# Patient Record
Sex: Male | Born: 1970 | Race: White | Hispanic: No | Marital: Married | Smoking: Current every day smoker
Health system: Southern US, Community
[De-identification: ages and names within clinical notes are randomized; demographics above are authoritative.]

## PROBLEM LIST (undated history)

## (undated) DIAGNOSIS — F319 Bipolar disorder, unspecified: Secondary | ICD-10-CM

## (undated) DIAGNOSIS — G459 Transient cerebral ischemic attack, unspecified: Secondary | ICD-10-CM

## (undated) DIAGNOSIS — I639 Cerebral infarction, unspecified: Secondary | ICD-10-CM

## (undated) DIAGNOSIS — E7800 Pure hypercholesterolemia, unspecified: Secondary | ICD-10-CM

## (undated) DIAGNOSIS — E119 Type 2 diabetes mellitus without complications: Secondary | ICD-10-CM

## (undated) HISTORY — PX: NO PAST SURGERIES: SHX2092

## (undated) HISTORY — DX: Bipolar disorder, unspecified: F31.9

## (undated) HISTORY — DX: Cerebral infarction, unspecified: I63.9

---

## 2004-07-24 ENCOUNTER — Emergency Department (HOSPITAL_COMMUNITY): Admission: EM | Admit: 2004-07-24 | Discharge: 2004-07-24 | Payer: Self-pay | Admitting: Emergency Medicine

## 2009-11-09 DIAGNOSIS — M545 Low back pain, unspecified: Secondary | ICD-10-CM | POA: Insufficient documentation

## 2012-01-22 DIAGNOSIS — F319 Bipolar disorder, unspecified: Secondary | ICD-10-CM | POA: Insufficient documentation

## 2017-01-23 ENCOUNTER — Emergency Department (HOSPITAL_COMMUNITY): Payer: Medicaid Other

## 2017-01-23 ENCOUNTER — Encounter (HOSPITAL_COMMUNITY): Payer: Self-pay | Admitting: *Deleted

## 2017-01-23 ENCOUNTER — Emergency Department (HOSPITAL_COMMUNITY)
Admission: EM | Admit: 2017-01-23 | Discharge: 2017-01-23 | Disposition: A | Discharge status: Home / Self Care | Payer: Medicaid Other | Attending: Emergency Medicine | Admitting: Emergency Medicine

## 2017-01-23 DIAGNOSIS — F17210 Nicotine dependence, cigarettes, uncomplicated: Secondary | ICD-10-CM | POA: Insufficient documentation

## 2017-01-23 DIAGNOSIS — Y92003 Bedroom of unspecified non-institutional (private) residence as the place of occurrence of the external cause: Secondary | ICD-10-CM | POA: Insufficient documentation

## 2017-01-23 DIAGNOSIS — Y939 Activity, unspecified: Secondary | ICD-10-CM | POA: Insufficient documentation

## 2017-01-23 DIAGNOSIS — E119 Type 2 diabetes mellitus without complications: Secondary | ICD-10-CM | POA: Insufficient documentation

## 2017-01-23 DIAGNOSIS — Z880 Allergy status to penicillin: Secondary | ICD-10-CM | POA: Diagnosis not present

## 2017-01-23 DIAGNOSIS — T189XXA Foreign body of alimentary tract, part unspecified, initial encounter: Secondary | ICD-10-CM | POA: Diagnosis present

## 2017-01-23 DIAGNOSIS — Z8673 Personal history of transient ischemic attack (TIA), and cerebral infarction without residual deficits: Secondary | ICD-10-CM | POA: Diagnosis not present

## 2017-01-23 DIAGNOSIS — Y33XXXA Other specified events, undetermined intent, initial encounter: Secondary | ICD-10-CM | POA: Diagnosis not present

## 2017-01-23 DIAGNOSIS — Y999 Unspecified external cause status: Secondary | ICD-10-CM | POA: Diagnosis not present

## 2017-01-23 HISTORY — DX: Transient cerebral ischemic attack, unspecified: G45.9

## 2017-01-23 HISTORY — DX: Type 2 diabetes mellitus without complications: E11.9

## 2017-01-23 HISTORY — DX: Pure hypercholesterolemia, unspecified: E78.00

## 2017-01-23 NOTE — ED Provider Notes (Signed)
[facility] DEPT Provider Note   CSN: [id number] Arrival date & time: 01/23/17  1555     History   Chief Complaint Chief Complaint  Patient presents with  . Swallowed Foreign Body    HPI Ernest Montoya is a 46 y.o. male.  Patient had a tongue stud that he believes came loose during the night in the swallowed it. Had a foreign body sensation in the lower part of his neck. Patient's been able to eat and drink and swallow without any difficulty. Patient denies any abdominal pain any trouble breathing no choking no nausea or vomiting. No blood in the stools.      Past Medical History:  Diagnosis Date  . Diabetes mellitus without complication (HCC)   . High cholesterol   . TIA (transient ischemic attack)    x2    There are no active problems to display for this patient.   History reviewed. No pertinent surgical history.     Home Medications    Prior to Admission medications   Medication Sig Start Date End Date Taking? Authorizing Provider  atorvastatin (LIPITOR) 40 MG tablet Take 40 mg by mouth daily at 6 PM.  11/04/16  Yes [provider]    Family History No family history on file.  Social History Social History  Substance Use Topics  . Smoking status: Current Every Day Smoker    Packs/day: 2.00    Types: Cigarettes  . Smokeless tobacco: Never Used  . Alcohol use Yes     Comment: very rarely     Allergies   Penicillins   Review of Systems Review of Systems  Constitutional: Negative for fever.  HENT: Negative for congestion.   Eyes: Negative for redness.  Respiratory: Negative for shortness of breath.   Cardiovascular: Negative for chest pain.  Gastrointestinal: Negative for abdominal pain, blood in stool, nausea and vomiting.  Genitourinary: Negative for dysuria.  Musculoskeletal: Negative for back pain.  Skin: Negative for rash.  Neurological: Negative for headaches.  Hematological: Does not bruise/bleed easily.    Psychiatric/Behavioral: Negative for confusion.     Physical Exam Updated Vital Signs BP 135/84   Pulse 66   Temp 98 F (36.7 C) (Oral)   Resp 16   Ht 1.753 m ( )   Wt 108.9 kg (240 lb)   SpO2 100%   BMI 35.44 kg/m   Physical Exam  Constitutional: He is oriented to person, place, and time. He appears well-developed and well-nourished. No distress.  HENT:  Head: Normocephalic and atraumatic.  Mouth/Throat: Oropharynx is clear and moist.  Eyes: Pupils are equal, round, and reactive to light. Conjunctivae and EOM are normal.  Neck: Normal range of motion. Neck supple.  Cardiovascular: Normal rate, regular rhythm and normal heart sounds.   Pulmonary/Chest: Effort normal and breath sounds normal. No respiratory distress.  Abdominal: Soft. Bowel sounds are normal. There is no tenderness.  Musculoskeletal: Normal range of motion.  Neurological: He is alert and oriented to person, place, and time. No cranial nerve deficit or sensory deficit. He exhibits normal muscle tone. Coordination normal.  Skin: Skin is warm.  Nursing note and vitals reviewed.    ED Treatments / Results  Labs (all labs ordered are listed, but only abnormal results are displayed) Labs Reviewed - No data to display  EKG  EKG Interpretation None       Radiology Dg Neck Soft Tissue  Result Date: 01/23/2017 CLINICAL DATA:  Throat discomfort. Patient possibly swallowed tongue ring. EXAM: NECK  SOFT TISSUES - 1+ VIEW COMPARISON:  None. FINDINGS: There is no evidence of retropharyngeal soft tissue swelling or epiglottic enlargement. The cervical airway is unremarkable and no radio-opaque foreign body identified in the neck. IMPRESSION: No radiopaque foreign body or significant soft tissue swelling in the neck. Electronically Signed   By: Delbert Phenix M.D.   On: 01/23/2017 18:02   Dg Abd Acute W/chest  Result Date: 01/23/2017 CLINICAL DATA:  Possible foreign body EXAM: DG ABDOMEN ACUTE W/ 1V CHEST  COMPARISON:  CT 07/24/2004 FINDINGS: Single-view chest demonstrates no acute consolidation or effusion. Normal cardiomediastinal silhouette. Supine and upright views of the abdomen demonstrate no free air beneath the diaphragm. Nonobstructed gas pattern. Two metallic density foreign bodies are seen in the right lower quadrant, projecting over the cecal region IMPRESSION: 1. No acute cardiopulmonary disease 2. Nonobstructed gas pattern 3. Metallic foreign bodies projecting over the right lower quadrant, presumably corresponding to the patient's history of swallowed tongue ring Electronically Signed   By: Jasmine Pang M.D.   On: 01/23/2017 18:02    Procedures Procedures (including critical care time)  Medications Ordered in ED Medications - No data to display   Initial Impression / Assessment and Plan / ED Course  I have reviewed the triage vital signs and the nursing notes.  Pertinent labs & imaging results that were available during my care of the patient were reviewed by me and considered in my medical decision making (see chart for details).     X-rays show evidence of foreign body right lower quadrant. Most likely patient will be able to pass it. Patient given precautions. We discharged home.    Final Clinical Impressions(s) / ED Diagnoses   Final diagnoses:  Foreign body alimentary tract, initial encounter    New Prescriptions New Prescriptions   No medications on file     Vanetta Mulders, MD 01/23/17 1840

## 2017-01-23 NOTE — ED Triage Notes (Signed)
Pt c/o throat discomfort. Pt reports about 0700 this morning he realized his tongue ring was no longer in place and thinks he may have swallowed it. Pt able to maintain saliva, breathe and speak freely.

## 2017-01-23 NOTE — Discharge Instructions (Signed)
He should pass the foreign body without difficulties. Return for abdominal pain nausea vomiting or fevers. Make an appointment to follow-up with your clinic for recheck.

## 2018-01-14 DIAGNOSIS — L02211 Cutaneous abscess of abdominal wall: Secondary | ICD-10-CM | POA: Diagnosis not present

## 2018-03-14 IMAGING — DX DG ABDOMEN ACUTE W/ 1V CHEST
3 series · 3 of 3 positions shown · non-contrast
Comparison: CT 07/24/2004

CLINICAL DATA: Possible foreign body

EXAM:
DG ABDOMEN ACUTE W/ 1V CHEST

[chest pa]
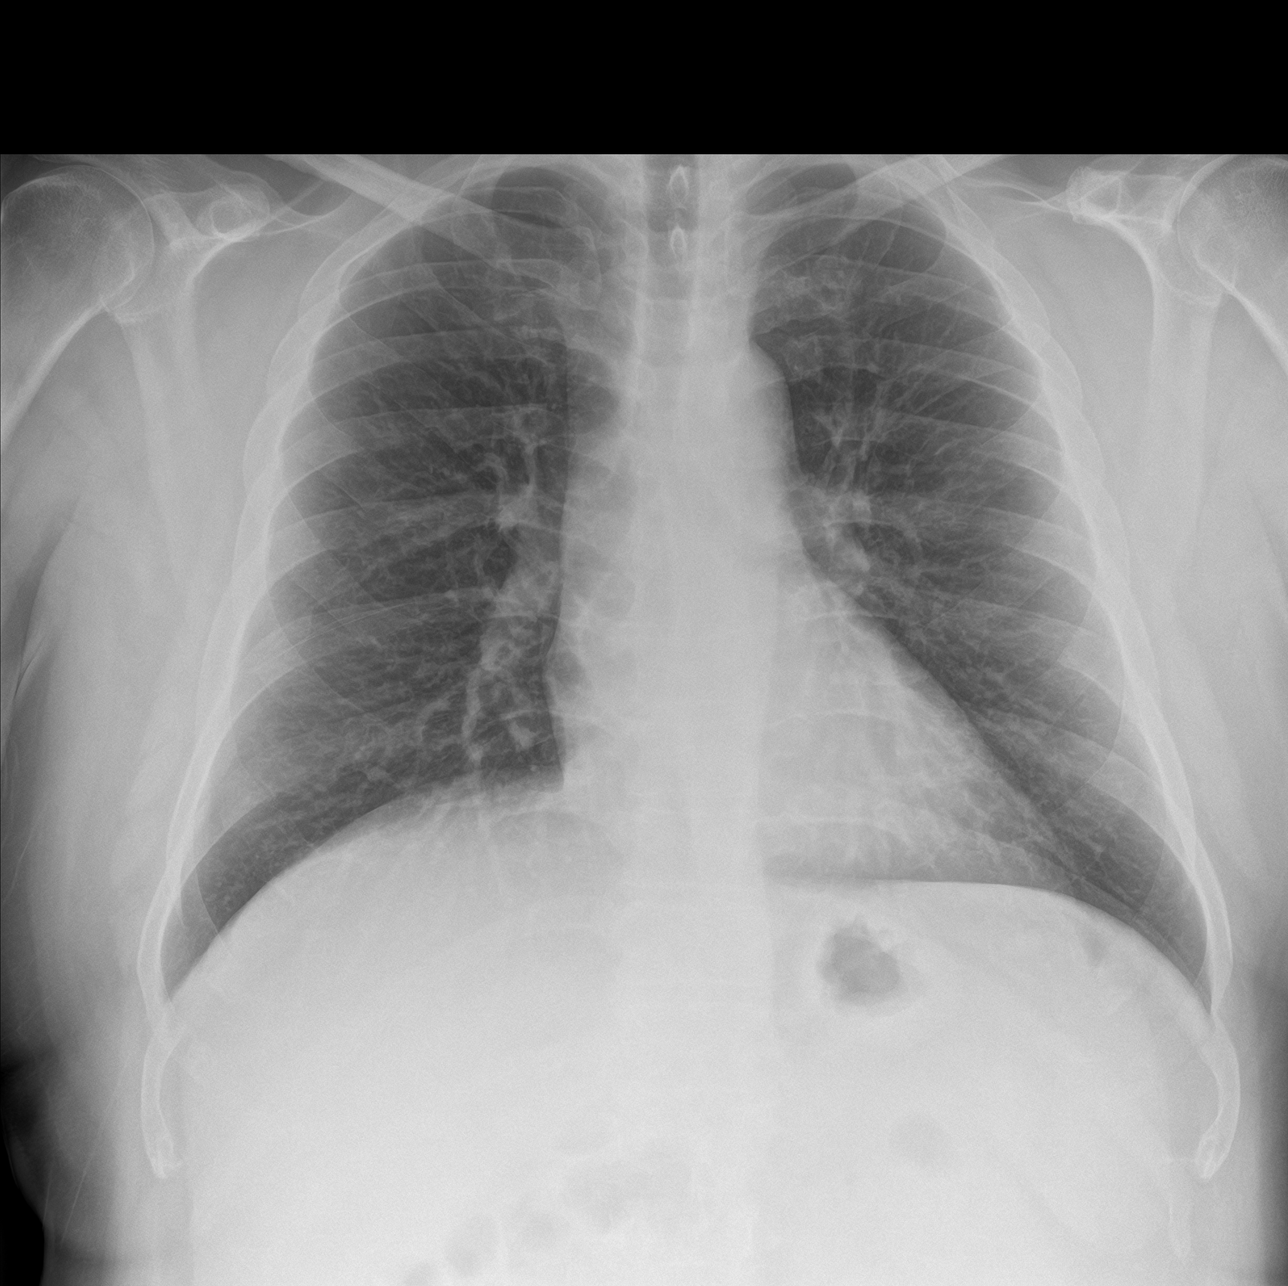

[abdomen erect]
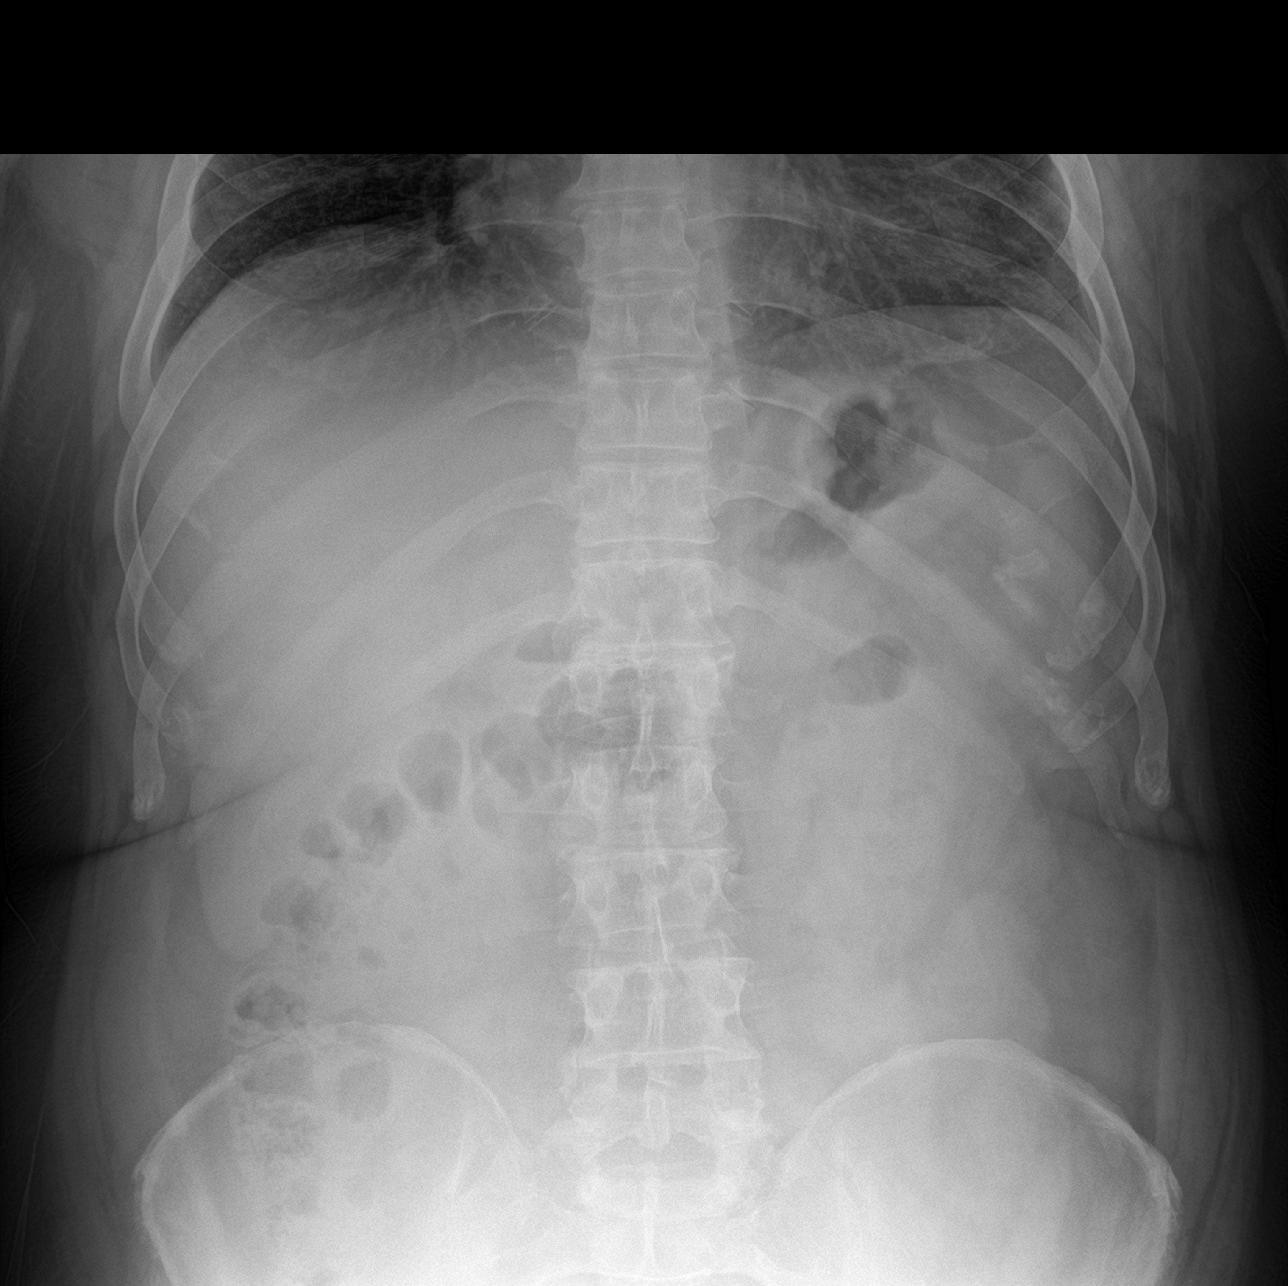

[abdomen supine]
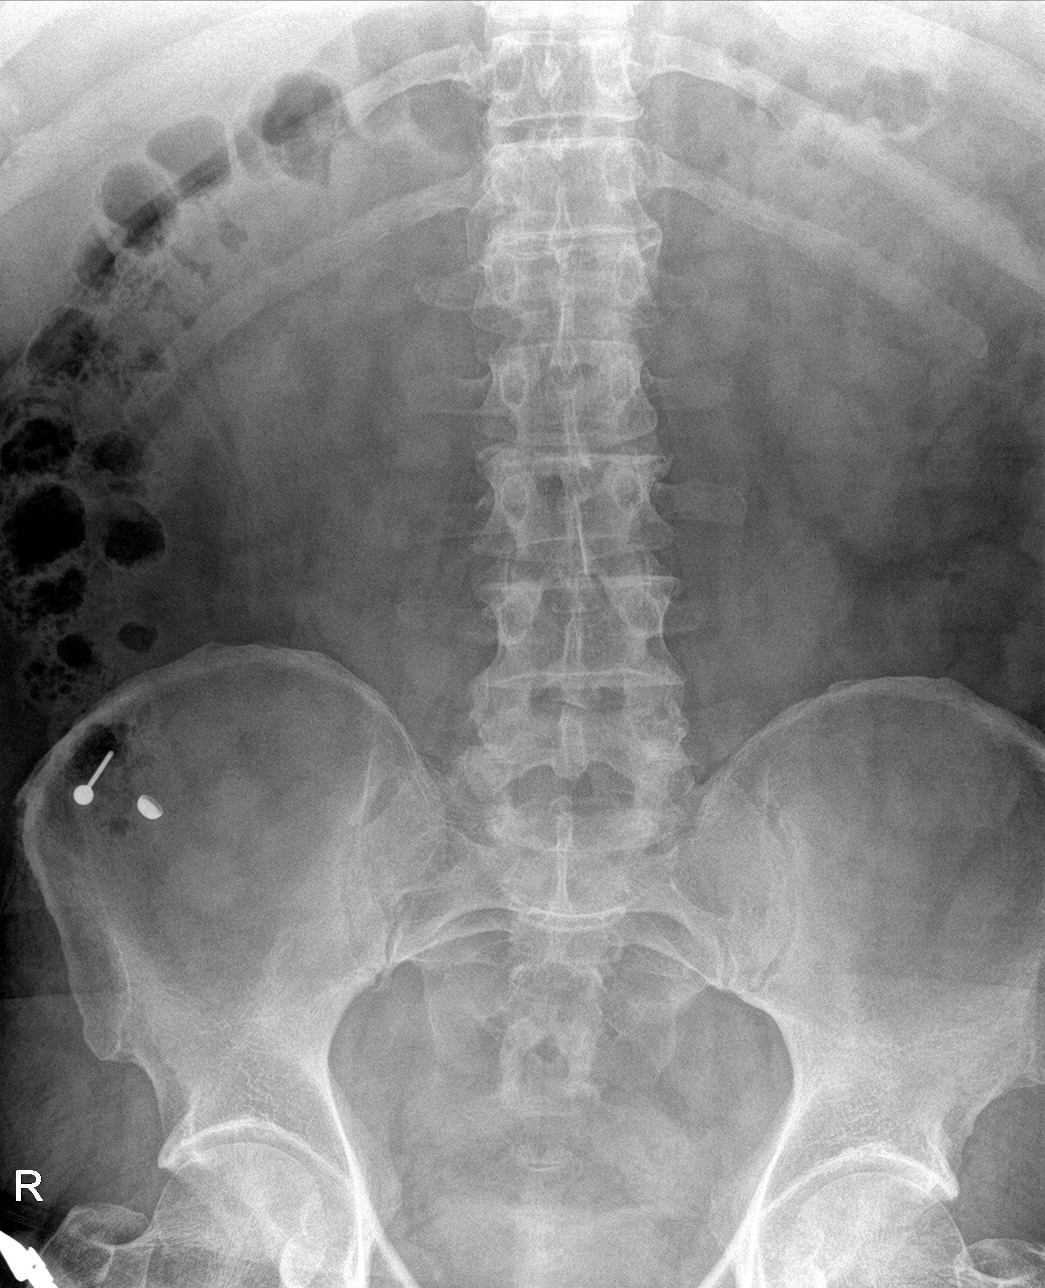

[3 of 3 positions shown; findings below may reference images not displayed]

FINDINGS: Single-view chest demonstrates no acute consolidation or effusion.
Normal cardiomediastinal silhouette.

Supine and upright views of the abdomen demonstrate no free air
beneath the diaphragm. Nonobstructed gas pattern. Two metallic
density foreign bodies are seen in the right lower quadrant,
projecting over the cecal region
IMPRESSION: 1. No acute cardiopulmonary disease
2. Nonobstructed gas pattern
3. Metallic foreign bodies projecting over the right lower quadrant,
presumably corresponding to the patient's history of swallowed
tongue ring

## 2020-03-30 DIAGNOSIS — J0100 Acute maxillary sinusitis, unspecified: Secondary | ICD-10-CM | POA: Diagnosis not present

## 2020-03-30 DIAGNOSIS — R0982 Postnasal drip: Secondary | ICD-10-CM | POA: Diagnosis not present

## 2020-03-30 DIAGNOSIS — J029 Acute pharyngitis, unspecified: Secondary | ICD-10-CM | POA: Diagnosis not present

## 2020-03-30 DIAGNOSIS — J111 Influenza due to unidentified influenza virus with other respiratory manifestations: Secondary | ICD-10-CM | POA: Diagnosis not present

## 2021-01-05 DIAGNOSIS — R059 Cough, unspecified: Secondary | ICD-10-CM | POA: Diagnosis not present

## 2021-01-05 DIAGNOSIS — J029 Acute pharyngitis, unspecified: Secondary | ICD-10-CM | POA: Diagnosis not present

## 2021-01-05 DIAGNOSIS — J329 Chronic sinusitis, unspecified: Secondary | ICD-10-CM | POA: Diagnosis not present

## 2021-02-15 DIAGNOSIS — R059 Cough, unspecified: Secondary | ICD-10-CM | POA: Diagnosis not present

## 2021-02-15 DIAGNOSIS — J329 Chronic sinusitis, unspecified: Secondary | ICD-10-CM | POA: Diagnosis not present

## 2021-06-13 DIAGNOSIS — M25552 Pain in left hip: Secondary | ICD-10-CM | POA: Diagnosis not present

## 2021-06-13 DIAGNOSIS — R102 Pelvic and perineal pain: Secondary | ICD-10-CM | POA: Diagnosis not present

## 2021-06-13 DIAGNOSIS — S73102A Unspecified sprain of left hip, initial encounter: Secondary | ICD-10-CM | POA: Diagnosis not present

## 2021-06-13 DIAGNOSIS — F17200 Nicotine dependence, unspecified, uncomplicated: Secondary | ICD-10-CM | POA: Diagnosis not present

## 2021-06-13 DIAGNOSIS — X501XXA Overexertion from prolonged static or awkward postures, initial encounter: Secondary | ICD-10-CM | POA: Diagnosis not present

## 2021-06-13 DIAGNOSIS — S76012A Strain of muscle, fascia and tendon of left hip, initial encounter: Secondary | ICD-10-CM | POA: Diagnosis not present

## 2021-06-13 DIAGNOSIS — Z880 Allergy status to penicillin: Secondary | ICD-10-CM | POA: Diagnosis not present

## 2021-07-20 DIAGNOSIS — R109 Unspecified abdominal pain: Secondary | ICD-10-CM | POA: Diagnosis not present

## 2021-07-20 DIAGNOSIS — K529 Noninfective gastroenteritis and colitis, unspecified: Secondary | ICD-10-CM | POA: Diagnosis not present

## 2021-07-20 DIAGNOSIS — R110 Nausea: Secondary | ICD-10-CM | POA: Diagnosis not present

## 2021-12-04 ENCOUNTER — Ambulatory Visit: Payer: Medicaid Other | Admitting: Nurse Practitioner

## 2022-04-19 DIAGNOSIS — R509 Fever, unspecified: Secondary | ICD-10-CM | POA: Diagnosis not present

## 2022-04-19 DIAGNOSIS — R059 Cough, unspecified: Secondary | ICD-10-CM | POA: Diagnosis not present

## 2022-04-19 DIAGNOSIS — K529 Noninfective gastroenteritis and colitis, unspecified: Secondary | ICD-10-CM | POA: Diagnosis not present

## 2022-04-19 DIAGNOSIS — R1110 Vomiting, unspecified: Secondary | ICD-10-CM | POA: Diagnosis not present

## 2022-04-19 DIAGNOSIS — R0981 Nasal congestion: Secondary | ICD-10-CM | POA: Diagnosis not present

## 2022-06-26 ENCOUNTER — Encounter: Payer: Self-pay | Admitting: Family Medicine

## 2022-06-26 ENCOUNTER — Ambulatory Visit: Payer: Medicaid Other | Admitting: Family Medicine

## 2022-06-26 VITALS — BP 126/83 | HR 73 | Temp 98.4°F | Ht 69.0 in | Wt 219.0 lb

## 2022-06-26 DIAGNOSIS — R222 Localized swelling, mass and lump, trunk: Secondary | ICD-10-CM

## 2022-06-26 DIAGNOSIS — E785 Hyperlipidemia, unspecified: Secondary | ICD-10-CM | POA: Diagnosis not present

## 2022-06-26 DIAGNOSIS — F319 Bipolar disorder, unspecified: Secondary | ICD-10-CM | POA: Diagnosis not present

## 2022-06-26 DIAGNOSIS — Z8673 Personal history of transient ischemic attack (TIA), and cerebral infarction without residual deficits: Secondary | ICD-10-CM | POA: Diagnosis not present

## 2022-06-26 DIAGNOSIS — G8929 Other chronic pain: Secondary | ICD-10-CM | POA: Diagnosis not present

## 2022-06-26 DIAGNOSIS — M545 Low back pain, unspecified: Secondary | ICD-10-CM | POA: Diagnosis not present

## 2022-06-26 DIAGNOSIS — E119 Type 2 diabetes mellitus without complications: Secondary | ICD-10-CM | POA: Diagnosis not present

## 2022-06-26 DIAGNOSIS — E1169 Type 2 diabetes mellitus with other specified complication: Secondary | ICD-10-CM | POA: Diagnosis not present

## 2022-06-26 DIAGNOSIS — I6502 Occlusion and stenosis of left vertebral artery: Secondary | ICD-10-CM

## 2022-06-26 LAB — BAYER DCA HB A1C WAIVED: HB A1C (BAYER DCA - WAIVED): 8.1 % — ABNORMAL HIGH (ref 4.8–5.6)

## 2022-06-26 MED ORDER — METFORMIN HCL 500 MG PO TABS: 500.0000 mg | ORAL_TABLET | Freq: Two times a day (BID) | ORAL | 3 refills | Status: DC

## 2022-06-26 MED ORDER — ASPIRIN 81 MG PO TBEC: 81.0000 mg | DELAYED_RELEASE_TABLET | Freq: Every day | ORAL | 12 refills | Status: DC

## 2022-06-26 NOTE — Patient Instructions (Signed)

## 2022-06-26 NOTE — Progress Notes (Signed)
Subjective:  Patient ID: Ernest Montoya, male    DOB: 1970/12/23, 52 y.o.   MRN: [id number]  Patient Care Team: Baruch Gouty, FNP as PCP - General (Family Medicine)   Chief Complaint:  New Patient (Initial Visit) ([facility] ) and Establish Care   HPI: Ernest Montoya is a 52 y.o. male presenting on 06/26/2022 for New Patient (Initial Visit) ([facility] ) and Establish Care   Pt presents today to establish care with new PCP. He was last seen by a PCP in 2018. He has a history of T2DM with associated hyperlipidemia and obesity. He reports a prior TIA due to vertebral artery stenosis. Has not been on ASA, plavix, or statin therapy. States he was told there was nothing they could do for the stenosis. He has chronic bipolar disorder and admits to being manic most of the time. States his mania is controlled, denies risky behavior, states he is just hyper. Prior history of back injury with chronic pain. He has not been on medications in several years for any of his diagnoses. He states the reason he came in today was to have a lump on his back checked. States this lump has been there for several months and has enlarged slightly. Not tender, no erythema or drainage. Denies injury.       Relevant past medical, surgical, family, and social history reviewed and updated as indicated.  Allergies and medications reviewed and updated. Data reviewed: Chart in Epic.   Past Medical History:  Diagnosis Date   Diabetes mellitus without complication ([place])    High cholesterol    TIA (transient ischemic attack)    x2    History reviewed. No pertinent surgical history.  Social History   Socioeconomic History   Marital status: Married    Spouse name: Not on file   Number of children: 3   Years of education: Not on file   Highest education level: Not on file  Occupational History   Not on file  Tobacco Use   Smoking status: Every Day    Packs/day: 1.50    Types: Cigarettes   Smokeless tobacco:  Never  Vaping Use   Vaping Use: Some days   Substances: THC  Substance and Sexual Activity   Alcohol use: Yes    Comment: very rarely   Drug use: Yes    Types: Marijuana   Sexual activity: Yes  Other Topics Concern   Not on file  Social History Narrative   Not on file   Social Determinants of Health   Financial Resource Strain: Not on file  Food Insecurity: Not on file  Transportation Needs: Not on file  Physical Activity: Not on file  Stress: Not on file  Social Connections: Not on file  Intimate Partner Violence: Not on file    Outpatient Encounter Medications as of 06/26/2022  Medication Sig   aspirin EC 81 MG tablet Take 1 tablet (81 mg total) by mouth daily. Swallow whole.   metFORMIN (GLUCOPHAGE) 500 MG tablet Take 1 tablet (500 mg total) by mouth 2 (two) times daily with a meal.   [DISCONTINUED] atorvastatin (LIPITOR) 40 MG tablet Take 40 mg by mouth daily at 6 PM.    No facility-administered encounter medications on file as of 06/26/2022.    Allergies  Allergen Reactions   Penicillins     Review of Systems  Constitutional:  Negative for activity change, appetite change, chills, diaphoresis, fatigue, fever and unexpected weight change.  HENT: Negative.  Eyes: Negative.  Negative for photophobia and visual disturbance.  Respiratory:  Negative for cough, chest tightness and shortness of breath.   Cardiovascular:  Negative for chest pain, palpitations and leg swelling.  Gastrointestinal:  Negative for abdominal pain, blood in stool, constipation, diarrhea, nausea and vomiting.  Endocrine: Negative.  Negative for cold intolerance, heat intolerance, polydipsia, polyphagia and polyuria.  Genitourinary:  Negative for decreased urine volume, difficulty urinating, dysuria, frequency and urgency.  Musculoskeletal:  Negative for arthralgias and myalgias.  Skin:        Mass to upper back  Allergic/Immunologic: Negative.   Neurological:  Negative for dizziness, tremors,  seizures, syncope, facial asymmetry, speech difficulty, weakness, light-headedness, numbness and headaches.  Hematological: Negative.   Psychiatric/Behavioral:  Negative for agitation, behavioral problems, confusion, decreased concentration, dysphoric mood, hallucinations, self-injury, sleep disturbance and suicidal ideas. The patient is hyperactive. The patient is not nervous/anxious.   All other systems reviewed and are negative.       Objective:  BP 126/83   Pulse 73   Temp 98.4 F (36.9 C) (Temporal)   Ht '5\' 9"'$  (1.753 m)   Wt 219 lb (99.3 kg)   SpO2 97%   BMI 32.34 kg/m    Wt Readings from Last 3 Encounters:  06/26/22 219 lb (99.3 kg)  01/23/17 240 lb (108.9 kg)    Physical Exam Vitals and nursing note reviewed.  Constitutional:      General: He is not in acute distress.    Appearance: Normal appearance. He is well-developed and well-groomed. He is obese. He is not ill-appearing, toxic-appearing or diaphoretic.  HENT:     Head: Normocephalic and atraumatic.     Jaw: There is normal jaw occlusion.     Right Ear: Hearing, tympanic membrane, ear canal and external ear normal.     Left Ear: Hearing, tympanic membrane, ear canal and external ear normal.     Nose: Nose normal.     Mouth/Throat:     Lips: Pink.     Mouth: Mucous membranes are moist.     Dentition: Abnormal dentition. Dental caries present.     Pharynx: Oropharynx is clear. Uvula midline.  Eyes:     General: Lids are normal.     Extraocular Movements: Extraocular movements intact.     Conjunctiva/sclera: Conjunctivae normal.     Pupils: Pupils are equal, round, and reactive to light.  Neck:     Thyroid: No thyroid mass, thyromegaly or thyroid tenderness.     Vascular: No carotid bruit or JVD.     Trachea: Trachea and phonation normal.  Cardiovascular:     Rate and Rhythm: Normal rate and regular rhythm.     Chest Wall: PMI is not displaced.     Pulses: Normal pulses.     Heart sounds: Normal heart  sounds. No murmur heard.    No friction rub. No gallop.  Pulmonary:     Effort: Pulmonary effort is normal. No respiratory distress.     Breath sounds: Normal breath sounds. No wheezing.  Abdominal:     General: Bowel sounds are normal. There is no distension or abdominal bruit.     Palpations: Abdomen is soft. There is no hepatomegaly or splenomegaly.     Tenderness: There is no abdominal tenderness. There is no right CVA tenderness or left CVA tenderness.     Hernia: No hernia is present.  Musculoskeletal:        General: Normal range of motion.     Cervical back:  Normal range of motion and neck supple.     Right lower leg: No edema.     Left lower leg: No edema.  Lymphadenopathy:     Cervical: No cervical adenopathy.  Skin:    General: Skin is warm and dry.     Capillary Refill: Capillary refill takes less than 2 seconds.     Coloration: Skin is not cyanotic, jaundiced or pale.     Findings: No rash.       Neurological:     General: No focal deficit present.     Mental Status: He is alert and oriented to person, place, and time.     Sensory: Sensation is intact.     Motor: Motor function is intact.     Coordination: Coordination is intact.     Gait: Gait is intact.     Deep Tendon Reflexes: Reflexes are normal and symmetric.  Psychiatric:        Attention and Perception: Attention and perception normal.        Mood and Affect: Mood and affect normal.        Speech: Speech normal.        Behavior: Behavior normal. Behavior is cooperative.        Thought Content: Thought content normal.        Cognition and Memory: Cognition and memory normal.        Judgment: Judgment normal.     Results for orders placed or performed in visit on 06/26/22  Bayer DCA Hb A1c Waived  Result Value Ref Range   HB A1C (BAYER DCA - WAIVED) 8.1 (H) 4.8 - 5.6 %       Pertinent labs & imaging results that were available during my care of the patient were reviewed by me and considered in my  medical decision making.  Assessment & Plan:  Tayo was seen today for new patient (initial visit) and establish care.  Diagnoses and all orders for this visit:  Controlled type 2 diabetes mellitus with other specified complication, without long-term current use of insulin ([place]) A1C 8.1 in office. Agrees to start metformin as prescribed. Aware to start check blood sugar at home. Other labs pending. ASA 81 mg daily as prescribed. Recommended statin and ACEi therapy. Pt will think about this.  -     Bayer DCA Hb A1c Waived -     Microalbumin / creatinine urine ratio -     aspirin EC 81 MG tablet; Take 1 tablet (81 mg total) by mouth daily. Swallow whole. -     metFORMIN (GLUCOPHAGE) 500 MG tablet; Take 1 tablet (500 mg total) by mouth 2 (two) times daily with a meal. -     CBC with Differential/Platelet -     CMP14+EGFR -     Lipid panel -     Thyroid Panel With TSH  Chronic midline low back pain without sciatica Doing well, does have daily pain but states he deals with it well.   Chronic bipolar disorder ([place]) Not on medications and feels he is managing well. Aware will refer to psychiatry if medications need to be initiated.  -     Thyroid Panel With TSH  Hyperlipidemia associated with type 2 diabetes mellitus ([place]) Labs pending. Statin therapy recommended, pt will think about this.  -     CMP14+EGFR -     Lipid panel  History of TIAs No recurrent symptoms. ASA as prescribed. Statin therapy recommended.  -  CBC with Differential/Platelet -     CMP14+EGFR -     Lipid panel  Vertebral artery stenosis/occlusion, left Recommended statin therapy. Pt will think about this.  -     aspirin EC 81 MG tablet; Take 1 tablet (81 mg total) by mouth daily. Swallow whole. -     CMP14+EGFR -     Lipid panel  Mass on back Likely a lipoma. Will obtain US. Further treatment pending results.  -     [place] CHEST SOFT TISSUE; Future     Continue all other maintenance medications.  Follow  up plan: Return in about 3 months (around 09/26/2022), or if symptoms worsen or fail to improve, for DM.   Continue healthy lifestyle choices, including diet (rich in fruits, vegetables, and lean proteins, and low in salt and simple carbohydrates) and exercise (at least 30 minutes of moderate physical activity daily).  Educational handout given for DM  The above assessment and management plan was discussed with the patient. The patient verbalized understanding of and has agreed to the management plan. Patient is aware to call the clinic if they develop any new symptoms or if symptoms persist or worsen. Patient is aware when to return to the clinic for a follow-up visit. Patient educated on when it is appropriate to go to the emergency department.   Monia Pouch, FNP-C [facility] [phone]

## 2022-06-27 LAB — CBC WITH DIFFERENTIAL/PLATELET
Basophils Absolute: 0.1 10*3/uL (ref 0.0–0.2)
Basos: 1 %
EOS (ABSOLUTE): 0.1 10*3/uL (ref 0.0–0.4)
Eos: 1 %
Hematocrit: 50.1 % (ref 37.5–51.0)
Hemoglobin: 17.8 g/dL — ABNORMAL HIGH (ref 13.0–17.7)
Immature Grans (Abs): 0 10*3/uL (ref 0.0–0.1)
Immature Granulocytes: 0 %
Lymphocytes Absolute: 2.8 10*3/uL (ref 0.7–3.1)
Lymphs: 23 %
MCH: 31.4 pg (ref 26.6–33.0)
MCHC: 35.5 g/dL (ref 31.5–35.7)
MCV: 89 fL (ref 79–97)
Monocytes Absolute: 0.7 10*3/uL (ref 0.1–0.9)
Monocytes: 6 %
Neutrophils Absolute: 8.4 10*3/uL — ABNORMAL HIGH (ref 1.4–7.0)
Neutrophils: 69 %
Platelets: 319 10*3/uL (ref 150–450)
RBC: 5.66 x10E6/uL (ref 4.14–5.80)
RDW: 12.3 % (ref 11.6–15.4)
WBC: 12.1 10*3/uL — ABNORMAL HIGH (ref 3.4–10.8)

## 2022-06-27 LAB — CMP14+EGFR
ALT: 29 IU/L (ref 0–44)
AST: 25 IU/L (ref 0–40)
Albumin/Globulin Ratio: 2.1 (ref 1.2–2.2)
Albumin: 5.1 g/dL — ABNORMAL HIGH (ref 3.8–4.9)
Alkaline Phosphatase: 92 IU/L (ref 44–121)
BUN/Creatinine Ratio: 17 (ref 9–20)
BUN: 13 mg/dL (ref 6–24)
Bilirubin Total: 1.3 mg/dL — ABNORMAL HIGH (ref 0.0–1.2)
CO2: 22 mmol/L (ref 20–29)
Calcium: 9.8 mg/dL (ref 8.7–10.2)
Chloride: 97 mmol/L (ref 96–106)
Creatinine, Ser: 0.77 mg/dL (ref 0.76–1.27)
Globulin, Total: 2.4 g/dL (ref 1.5–4.5)
Glucose: 195 mg/dL — ABNORMAL HIGH (ref 70–99)
Potassium: 4.7 mmol/L (ref 3.5–5.2)
Sodium: 138 mmol/L (ref 134–144)
Total Protein: 7.5 g/dL (ref 6.0–8.5)
eGFR: 107 mL/min/{1.73_m2} (ref >59)

## 2022-06-27 LAB — LIPID PANEL
Chol/HDL Ratio: 4.8 ratio (ref 0.0–5.0)
Cholesterol, Total: 218 mg/dL — ABNORMAL HIGH (ref 100–199)
HDL: 45 mg/dL (ref >39)
LDL Chol Calc (NIH): 141 mg/dL — ABNORMAL HIGH (ref 0–99)
Triglycerides: 175 mg/dL — ABNORMAL HIGH (ref 0–149)
VLDL Cholesterol Cal: 32 mg/dL (ref 5–40)

## 2022-06-27 LAB — MICROALBUMIN / CREATININE URINE RATIO
Creatinine, Urine: 282.8 mg/dL
Microalb/Creat Ratio: 15 mg/g creat (ref 0–29)
Microalbumin, Urine: 41.2 ug/mL

## 2022-06-27 LAB — THYROID PANEL WITH TSH
Free Thyroxine Index: 2.9 (ref 1.2–4.9)
T3 Uptake Ratio: 29 % (ref 24–39)
T4, Total: 10.1 ug/dL (ref 4.5–12.0)
TSH: 1.08 u[IU]/mL (ref 0.450–4.500)

## 2022-06-28 MED ORDER — ATORVASTATIN CALCIUM 20 MG PO TABS: 20.0000 mg | ORAL_TABLET | Freq: Every day | ORAL | 3 refills | Status: DC

## 2022-06-28 NOTE — Addendum Note (Signed)
Addended by: Baruch Gouty on: 06/28/2022 09:58 AM   Modules accepted: Orders

## 2022-07-02 ENCOUNTER — Ambulatory Visit (HOSPITAL_COMMUNITY)
Admission: RE | Admit: 2022-07-02 | Discharge: 2022-07-02 | Disposition: A | Discharge status: Home / Self Care | Payer: Medicaid Other | Source: Ambulatory Visit | Attending: Family Medicine | Admitting: Family Medicine

## 2022-07-02 DIAGNOSIS — R222 Localized swelling, mass and lump, trunk: Secondary | ICD-10-CM | POA: Diagnosis not present

## 2022-07-18 ENCOUNTER — Telehealth: Payer: Self-pay | Admitting: Family Medicine

## 2022-07-18 NOTE — Telephone Encounter (Signed)
Patient notified and verbalized understanding. Doesn't want to be referred at this time. Will contact the office if he decides to have something done

## 2022-07-18 NOTE — Telephone Encounter (Signed)
Patient said that he received a letter about test results but the letter did not have the results so he would like to speak to someone about these results from his [place]

## 2022-09-26 ENCOUNTER — Ambulatory Visit: Payer: Medicaid Other | Admitting: Family Medicine

## 2022-09-26 ENCOUNTER — Encounter: Payer: Self-pay | Admitting: Family Medicine

## 2022-10-30 ENCOUNTER — Encounter: Payer: Self-pay | Admitting: Family Medicine

## 2022-10-30 ENCOUNTER — Ambulatory Visit: Payer: Medicaid Other | Admitting: Family Medicine

## 2022-10-30 VITALS — BP 140/80 | HR 64 | Temp 97.1°F | Ht 69.0 in | Wt 216.2 lb

## 2022-10-30 DIAGNOSIS — L02215 Cutaneous abscess of perineum: Secondary | ICD-10-CM | POA: Diagnosis not present

## 2022-10-30 MED ORDER — SULFAMETHOXAZOLE-TRIMETHOPRIM 800-160 MG PO TABS: 1.0000 | ORAL_TABLET | Freq: Two times a day (BID) | ORAL | 0 refills | Status: DC

## 2022-10-30 NOTE — Progress Notes (Signed)
Subjective:  Patient ID: Ernest Montoya, male    DOB: [birth year], 52 y.o.   MRN: [id number]  Patient Care Team: Sonny Masters, FNP as PCP - General (Family Medicine)   Chief Complaint:  Cyst (Patient states that he has a knot on his groin area that has been there since Saturday.  States it has drained some. )   HPI: Ernest Montoya is a 52 y.o. male presenting on 10/30/2022 for Cyst (Patient states that he has a knot on his groin area that has been there since Saturday.  States it has drained some. )   Perineal abscess Aprox 1cm closed firm non fluctuant nodule on perineal area on posterior scrotum TTP 4 days . Pt states that noticed in shower Pt states site briefly bled when he toweled off after shower. Pt states it is an 8/10 when pushed on or walking. Pt states he can't work like this. Denies any home treatment. Denies spider bite but works outside and does [employer].  Relevant past medical, surgical, family, and social history reviewed and updated as indicated.  Allergies and medications reviewed and updated. Data reviewed: Chart in Epic.   Past Medical History:  Diagnosis Date   Diabetes mellitus without complication (HCC)    High cholesterol    TIA (transient ischemic attack)    x2    History reviewed. No pertinent surgical history.  Social History   Socioeconomic History   Marital status: Married    Spouse name: Not on file   Number of children: 3   Years of education: Not on file   Highest education level: Not on file  Occupational History   Not on file  Tobacco Use   Smoking status: Every Day    Packs/day: 1.5    Types: Cigarettes   Smokeless tobacco: Never  Vaping Use   Vaping Use: Some days   Substances: THC  Substance and Sexual Activity   Alcohol use: Yes    Comment: very rarely   Drug use: Yes    Types: Marijuana   Sexual activity: Yes  Other Topics Concern   Not on file  Social History Narrative   Not on file   Social Determinants of  Health   Financial Resource Strain: Not on file  Food Insecurity: Not on file  Transportation Needs: Not on file  Physical Activity: Not on file  Stress: Not on file  Social Connections: Not on file  Intimate Partner Violence: Not on file    Outpatient Encounter Medications as of 10/30/2022  Medication Sig   sulfamethoxazole-trimethoprim (BACTRIM DS) 800-160 MG tablet Take 1 tablet by mouth 2 (two) times daily.   [DISCONTINUED] aspirin EC 81 MG tablet Take 1 tablet (81 mg total) by mouth daily. Swallow whole. (Patient not taking: Reported on 10/30/2022)   [DISCONTINUED] atorvastatin (LIPITOR) 20 MG tablet Take 1 tablet (20 mg total) by mouth daily. (Patient not taking: Reported on 10/30/2022)   [DISCONTINUED] metFORMIN (GLUCOPHAGE) 500 MG tablet Take 1 tablet (500 mg total) by mouth 2 (two) times daily with a meal. (Patient not taking: Reported on 10/30/2022)   No facility-administered encounter medications on file as of 10/30/2022.    Allergies  Allergen Reactions   Penicillins     Review of Systems  Constitutional: Negative.   HENT: Negative.    Eyes: Negative.   Respiratory: Negative.    Cardiovascular: Negative.   Gastrointestinal: Negative.   Endocrine: Negative.   Genitourinary: Negative.   Musculoskeletal: Negative.  Skin:  Positive for color change and wound.  Neurological: Negative.   Hematological: Negative.   Psychiatric/Behavioral: Negative.  Negative for self-injury, sleep disturbance and suicidal ideas.   All other systems reviewed and are negative.       Objective:  BP (!) 140/80   Pulse 64   Temp (!) 97.1 F (36.2 C) (Temporal)   Ht 5\' 9"  (1.753 m)   Wt 216 lb 3.2 oz (98.1 kg)   SpO2 98%   BMI 31.93 kg/m    Wt Readings from Last 3 Encounters:  10/30/22 216 lb 3.2 oz (98.1 kg)  06/26/22 219 lb (99.3 kg)  01/23/17 240 lb (108.9 kg)    Physical Exam Vitals and nursing note reviewed.  Constitutional:      Appearance: Normal appearance. He is obese.   HENT:     Head: Normocephalic and atraumatic.     Nose: Nose normal.     Mouth/Throat:     Mouth: Mucous membranes are moist.  Eyes:     Conjunctiva/sclera: Conjunctivae normal.     Pupils: Pupils are equal, round, and reactive to light.  Cardiovascular:     Rate and Rhythm: Normal rate and regular rhythm.     Heart sounds: Normal heart sounds.  Pulmonary:     Effort: Pulmonary effort is normal.     Breath sounds: Normal breath sounds.  Abdominal:     General: Bowel sounds are normal.     Palpations: Abdomen is soft.  Genitourinary:    Penis: Normal.      Testes: Normal.    Musculoskeletal:        General: Normal range of motion.     Cervical back: Normal range of motion.  Skin:    Capillary Refill: Capillary refill takes less than 2 seconds.     Findings: Lesion (posterior scrotum, perineal) present.  Neurological:     General: No focal deficit present.     Mental Status: He is alert and oriented to person, place, and time.  Psychiatric:        Mood and Affect: Mood normal.        Behavior: Behavior normal.        Thought Content: Thought content normal.        Judgment: Judgment normal.     Results for orders placed or performed in visit on 06/26/22  Bayer DCA Hb A1c Waived  Result Value Ref Range   HB A1C (BAYER DCA - WAIVED) 8.1 (H) 4.8 - 5.6 %  Microalbumin / creatinine urine ratio  Result Value Ref Range   Creatinine, Urine 282.8 Not Estab. mg/dL   Microalbumin, Urine 16.1 Not Estab. ug/mL   Microalb/Creat Ratio 15 0 - 29 mg/g creat  CBC with Differential/Platelet  Result Value Ref Range   WBC 12.1 (H) 3.4 - 10.8 x10E3/uL   RBC 5.66 4.14 - 5.80 x10E6/uL   Hemoglobin 17.8 (H) 13.0 - 17.7 g/dL   Hematocrit 09.6 04.5 - 51.0 %   MCV 89 79 - 97 fL   MCH 31.4 26.6 - 33.0 pg   MCHC 35.5 31.5 - 35.7 g/dL   RDW 40.9 81.1 - 91.4 %   Platelets 319 150 - 450 x10E3/uL   Neutrophils 69 Not Estab. %   Lymphs 23 Not Estab. %   Monocytes 6 Not Estab. %   Eos 1 Not  Estab. %   Basos 1 Not Estab. %   Neutrophils Absolute 8.4 (H) 1.4 - 7.0 x10E3/uL   Lymphocytes Absolute  2.8 0.7 - 3.1 x10E3/uL   Monocytes Absolute 0.7 0.1 - 0.9 x10E3/uL   EOS (ABSOLUTE) 0.1 0.0 - 0.4 x10E3/uL   Basophils Absolute 0.1 0.0 - 0.2 x10E3/uL   Immature Granulocytes 0 Not Estab. %   Immature Grans (Abs) 0.0 0.0 - 0.1 x10E3/uL  CMP14+EGFR  Result Value Ref Range   Glucose 195 (H) 70 - 99 mg/dL   BUN 13 6 - 24 mg/dL   Creatinine, Ser 1.61 0.76 - 1.27 mg/dL   eGFR 096 >04 VW/UJW/1.19   BUN/Creatinine Ratio 17 9 - 20   Sodium 138 134 - 144 mmol/L   Potassium 4.7 3.5 - 5.2 mmol/L   Chloride 97 96 - 106 mmol/L   CO2 22 20 - 29 mmol/L   Calcium 9.8 8.7 - 10.2 mg/dL   Total Protein 7.5 6.0 - 8.5 g/dL   Albumin 5.1 (H) 3.8 - 4.9 g/dL   Globulin, Total 2.4 1.5 - 4.5 g/dL   Albumin/Globulin Ratio 2.1 1.2 - 2.2   Bilirubin Total 1.3 (H) 0.0 - 1.2 mg/dL   Alkaline Phosphatase 92 44 - 121 IU/L   AST 25 0 - 40 IU/L   ALT 29 0 - 44 IU/L  Lipid panel  Result Value Ref Range   Cholesterol, Total 218 (H) 100 - 199 mg/dL   Triglycerides 147 (H) 0 - 149 mg/dL   HDL 45 >82 mg/dL   VLDL Cholesterol Cal 32 5 - 40 mg/dL   LDL Chol Calc (NIH) 956 (H) 0 - 99 mg/dL   Chol/HDL Ratio 4.8 0.0 - 5.0 ratio  Thyroid Panel With TSH  Result Value Ref Range   TSH 1.080 0.450 - 4.500 uIU/mL   T4, Total 10.1 4.5 - 12.0 ug/dL   T3 Uptake Ratio 29 24 - 39 %   Free Thyroxine Index 2.9 1.2 - 4.9   No edema or erythema. Access is firm approximately 1cm diameter under skin- not feculent. Central lesion not draining at this time. Grimace with palpation and exam.     Pertinent labs & imaging results that were available during my care of the patient were reviewed by me and considered in my medical decision making.  Assessment & Plan:  Jhalen was seen today for abscess to posterior scrotum on perineum  Diagnoses and all orders for this visit:  Perineal abscess Bactrim 800/160mg  PO BID  Sits-bath  and warm compresses to abscess Educated patient on: Keep skin area clean with antibacterial soap  Wash hands before and after touching wound area Do not attempt to lance or pop. Signs of worsening infection --increased edema/erythema Seek immediate medical care if fever greater than 100.0*F Seek immediate medical care with worsening of infection     Continue all other maintenance medications.  Follow up plan: Return in 1 month (on 11/30/2022), or if symptoms worsen or fail to improve, for DM.   Continue healthy lifestyle choices, including diet (rich in fruits, vegetables, and lean proteins, and low in salt and simple carbohydrates) and exercise (at least 30 minutes of moderate physical activity daily   The above assessment and management plan was discussed with the patient. The patient verbalized understanding of and has agreed to the management plan. Patient is aware to call the clinic if they develop any new symptoms or if symptoms persist or worsen. Patient is aware when to return to the clinic for a follow-up visit. Patient educated on when it is appropriate to go to the emergency department.   Maryelizabeth Kaufmann NP student  [facility] [phone]  I personally was present during the history, physical exam, and medical decision-making activities of this visit and have verified that the services and findings are accurately documented in the nurse practitioner student's note.  Kari Baars, FNP-C [facility] [address] [place], [place] [address] [phone]

## 2023-01-25 ENCOUNTER — Other Ambulatory Visit: Payer: Self-pay | Admitting: Family Medicine

## 2023-01-25 DIAGNOSIS — Z1211 Encounter for screening for malignant neoplasm of colon: Secondary | ICD-10-CM

## 2023-01-25 DIAGNOSIS — Z1212 Encounter for screening for malignant neoplasm of rectum: Secondary | ICD-10-CM

## 2023-03-26 ENCOUNTER — Telehealth: Payer: Self-pay | Admitting: Family Medicine

## 2023-04-10 ENCOUNTER — Ambulatory Visit: Payer: Medicaid Other | Admitting: Family Medicine

## 2023-04-19 ENCOUNTER — Ambulatory Visit (INDEPENDENT_AMBULATORY_CARE_PROVIDER_SITE_OTHER): Payer: Medicaid Other | Admitting: *Deleted

## 2023-04-19 ENCOUNTER — Ambulatory Visit: Payer: Medicaid Other

## 2023-04-19 DIAGNOSIS — E1169 Type 2 diabetes mellitus with other specified complication: Secondary | ICD-10-CM | POA: Diagnosis not present

## 2023-04-19 NOTE — Progress Notes (Signed)
Ernest Montoya arrived 04/19/2023 and has given verbal consent to obtain images and complete their overdue diabetic retinal screening.  The images have been sent to an ophthalmologist or optometrist for review and interpretation.  Results will be sent back to Sonny Masters, FNP for review.  Patient has been informed they will be contacted when we receive the results via telephone or MyChart

## 2023-11-04 ENCOUNTER — Encounter

## 2023-11-11 ENCOUNTER — Emergency Department (HOSPITAL_COMMUNITY)
Admission: EM | Admit: 2023-11-11 | Discharge: 2023-11-11 | Disposition: A | Discharge status: Home / Self Care | Attending: Emergency Medicine | Admitting: Emergency Medicine

## 2023-11-11 ENCOUNTER — Other Ambulatory Visit: Payer: Self-pay

## 2023-11-11 ENCOUNTER — Emergency Department (HOSPITAL_COMMUNITY)

## 2023-11-11 ENCOUNTER — Encounter (HOSPITAL_COMMUNITY): Payer: Self-pay

## 2023-11-11 DIAGNOSIS — K5730 Diverticulosis of large intestine without perforation or abscess without bleeding: Secondary | ICD-10-CM | POA: Diagnosis not present

## 2023-11-11 DIAGNOSIS — R739 Hyperglycemia, unspecified: Secondary | ICD-10-CM | POA: Insufficient documentation

## 2023-11-11 DIAGNOSIS — G510 Bell's palsy: Secondary | ICD-10-CM | POA: Diagnosis not present

## 2023-11-11 DIAGNOSIS — R079 Chest pain, unspecified: Secondary | ICD-10-CM | POA: Insufficient documentation

## 2023-11-11 DIAGNOSIS — R531 Weakness: Secondary | ICD-10-CM | POA: Diagnosis not present

## 2023-11-11 DIAGNOSIS — R202 Paresthesia of skin: Secondary | ICD-10-CM | POA: Diagnosis not present

## 2023-11-11 DIAGNOSIS — R0789 Other chest pain: Secondary | ICD-10-CM | POA: Diagnosis not present

## 2023-11-11 DIAGNOSIS — K6389 Other specified diseases of intestine: Secondary | ICD-10-CM | POA: Diagnosis not present

## 2023-11-11 DIAGNOSIS — R29818 Other symptoms and signs involving the nervous system: Secondary | ICD-10-CM | POA: Diagnosis not present

## 2023-11-11 DIAGNOSIS — E1165 Type 2 diabetes mellitus with hyperglycemia: Secondary | ICD-10-CM | POA: Diagnosis not present

## 2023-11-11 LAB — ETHANOL: Alcohol, Ethyl (B): <15 mg/dL (ref <15)

## 2023-11-11 LAB — DIFFERENTIAL
Abs Immature Granulocytes: 0.13 K/uL — ABNORMAL HIGH (ref 0.00–0.07)
Basophils Absolute: 0.1 K/uL (ref 0.0–0.1)
Basophils Relative: 1 %
Eosinophils Absolute: 0.3 K/uL (ref 0.0–0.5)
Eosinophils Relative: 3 %
Immature Granulocytes: 1 %
Lymphocytes Relative: 32 %
Lymphs Abs: 3.5 K/uL (ref 0.7–4.0)
Monocytes Absolute: 0.8 K/uL (ref 0.1–1.0)
Monocytes Relative: 7 %
Neutro Abs: 6.1 K/uL (ref 1.7–7.7)
Neutrophils Relative %: 56 %

## 2023-11-11 LAB — COMPREHENSIVE METABOLIC PANEL WITH GFR
ALT: 23 U/L (ref 0–44)
AST: 30 U/L (ref 15–41)
Albumin: 3.7 g/dL (ref 3.5–5.0)
Alkaline Phosphatase: 108 U/L (ref 38–126)
Anion gap: 15 (ref 5–15)
BUN: 9 mg/dL (ref 6–20)
CO2: 16 mmol/L — ABNORMAL LOW (ref 22–32)
Calcium: 9.2 mg/dL (ref 8.9–10.3)
Chloride: 101 mmol/L (ref 98–111)
Creatinine, Ser: 0.84 mg/dL (ref 0.61–1.24)
GFR, Estimated: >60 mL/min (ref >60)
Glucose, Bld: 358 mg/dL — ABNORMAL HIGH (ref 70–99)
Potassium: 3.7 mmol/L (ref 3.5–5.1)
Sodium: 132 mmol/L — ABNORMAL LOW (ref 135–145)
Total Bilirubin: 0.9 mg/dL (ref 0.0–1.2)
Total Protein: 7.2 g/dL (ref 6.5–8.1)

## 2023-11-11 LAB — BASIC METABOLIC PANEL WITH GFR
Anion gap: 12 (ref 5–15)
BUN: 10 mg/dL (ref 6–20)
CO2: 22 mmol/L (ref 22–32)
Calcium: 8.4 mg/dL — ABNORMAL LOW (ref 8.9–10.3)
Chloride: 101 mmol/L (ref 98–111)
Creatinine, Ser: 0.72 mg/dL (ref 0.61–1.24)
GFR, Estimated: >60 mL/min (ref >60)
Glucose, Bld: 309 mg/dL — ABNORMAL HIGH (ref 70–99)
Potassium: 4.1 mmol/L (ref 3.5–5.1)
Sodium: 135 mmol/L (ref 135–145)

## 2023-11-11 LAB — CBC
HCT: 44.3 % (ref 39.0–52.0)
Hemoglobin: 15.8 g/dL (ref 13.0–17.0)
MCH: 30.2 pg (ref 26.0–34.0)
MCHC: 35.7 g/dL (ref 30.0–36.0)
MCV: 84.5 fL (ref 80.0–100.0)
Platelets: 344 K/uL (ref 150–400)
RBC: 5.24 MIL/uL (ref 4.22–5.81)
RDW: 12 % (ref 11.5–15.5)
WBC: 10.9 K/uL — ABNORMAL HIGH (ref 4.0–10.5)
nRBC: 0 % (ref 0.0–0.2)

## 2023-11-11 LAB — TROPONIN I (HIGH SENSITIVITY)
Troponin I (High Sensitivity): 5 ng/L (ref <18)
Troponin I (High Sensitivity): 6 ng/L (ref <18)

## 2023-11-11 LAB — I-STAT VENOUS BLOOD GAS, ED
Acid-Base Excess: 0 mmol/L (ref 0.0–2.0)
Bicarbonate: 22.3 mmol/L (ref 20.0–28.0)
Calcium, Ion: 0.97 mmol/L — ABNORMAL LOW (ref 1.15–1.40)
HCT: 41 % (ref 39.0–52.0)
Hemoglobin: 13.9 g/dL (ref 13.0–17.0)
O2 Saturation: 86 %
Potassium: 3.9 mmol/L (ref 3.5–5.1)
Sodium: 135 mmol/L (ref 135–145)
TCO2: 23 mmol/L (ref 22–32)
pCO2, Ven: 29.4 mmHg — ABNORMAL LOW (ref 44–60)
pH, Ven: 7.487 — ABNORMAL HIGH (ref 7.25–7.43)
pO2, Ven: 47 mmHg — ABNORMAL HIGH (ref 32–45)

## 2023-11-11 LAB — I-STAT CHEM 8, ED
BUN: 11 mg/dL (ref 6–20)
Calcium, Ion: 1.13 mmol/L — ABNORMAL LOW (ref 1.15–1.40)
Chloride: 101 mmol/L (ref 98–111)
Creatinine, Ser: 0.7 mg/dL (ref 0.61–1.24)
Glucose, Bld: 363 mg/dL — ABNORMAL HIGH (ref 70–99)
HCT: 45 % (ref 39.0–52.0)
Hemoglobin: 15.3 g/dL (ref 13.0–17.0)
Potassium: 3.7 mmol/L (ref 3.5–5.1)
Sodium: 134 mmol/L — ABNORMAL LOW (ref 135–145)
TCO2: 18 mmol/L — ABNORMAL LOW (ref 22–32)

## 2023-11-11 LAB — APTT: aPTT: 27 s (ref 24–36)

## 2023-11-11 LAB — PROTIME-INR
INR: 1 (ref 0.8–1.2)
Prothrombin Time: 13.4 s (ref 11.4–15.2)

## 2023-11-11 LAB — CBG MONITORING, ED: Glucose-Capillary: 390 mg/dL — ABNORMAL HIGH (ref 70–99)

## 2023-11-11 MED ORDER — LACTATED RINGERS IV BOLUS
1000.0000 mL | Freq: Once | INTRAVENOUS | Status: AC
  Administered 2023-11-11: 1000 mL via INTRAVENOUS

## 2023-11-11 MED ORDER — SODIUM CHLORIDE 0.9% FLUSH
3.0000 mL | Freq: Once | INTRAVENOUS | Status: AC
  Administered 2023-11-11: 3 mL via INTRAVENOUS

## 2023-11-11 MED ORDER — IOHEXOL 350 MG/ML SOLN
100.0000 mL | Freq: Once | INTRAVENOUS | Status: AC | PRN
  Administered 2023-11-11: 100 mL via INTRAVENOUS

## 2023-11-11 MED ORDER — METFORMIN HCL 500 MG PO TABS: 500.0000 mg | ORAL_TABLET | Freq: Two times a day (BID) | ORAL | 0 refills | Status: AC

## 2023-11-11 NOTE — ED Notes (Signed)
 CCMD contacted to place the patient on cardiac monitoring services.

## 2023-11-11 NOTE — Discharge Instructions (Addendum)
 Your blood work today shows that you have elevated blood sugar concerning for diabetes.  We are starting you on metformin .  There is no evidence of a heart attack today in regards to your chest pain.  Follow-up with your primary care physician in regards to both of these.  Your CT scan did not show any evidence of an aortic aneurysm or dissection, however it did show an area in your colon that is concerning for a colon cancer.  You also have some abnormal lymph nodes.  It is very important to follow-up with a gastroenterologist for a colonoscopy.  You are being referred to 1 and should also discuss this with your primary care provider.  If you develop recurrent, continued, or worsening chest pain, shortness of breath, fever, vomiting, abdominal or back pain, or any other new/concerning symptoms then return to the ER for evaluation.

## 2023-11-11 NOTE — ED Provider Notes (Signed)
 Care transferred to me.  MRI does not show any evidence of stroke.  The left sided weakness/numbness seems to come and go and is not present.  Ongoing for several days.  I doubt this is stroke/TIA.    Unfortunately his CTA does show evidence of what is concerning for a colon mass/cancer.  I made patient and family aware of this.  He will need outpatient follow-up.  As I was reviewing his workup prior to discharge I noticed that his anion gap was borderline at 15 with hyperglycemia.  BMP was repeated and the anion gap is normal.  Glucose still little high, he states he does not have a history of diabetes currently but was treated in the past.  Will put him on metformin .  Overall he feels well enough for discharge, I think this is reasonable and will discharge home with return precautions, PCP follow-up, and GI referral.   Freddi Hamilton, MD 11/11/23 2325

## 2023-11-11 NOTE — Inpatient Diabetes Management (Signed)
 Inpatient Diabetes Program Recommendations  AACE/ADA: New Consensus Statement on Inpatient Glycemic Control (2015)  Target Ranges:  Prepandial:   less than 140 mg/dL      Peak postprandial:   less than 180 mg/dL (1-2 hours)      Critically ill patients:  140 - 180 mg/dL   Lab Results  Component Value Date   GLUCAP 390 (H) 11/11/2023   HGBA1C 8.1 (H) 06/26/2022    Review of Glycemic Control  Diabetes history: DM type 2 Outpatient Diabetes medications: none listed Current orders for Inpatient glycemic control: None, being evaluated in ED  Note: Glucose 390 on presentation A1c 8.1% on 3/5  Inpatient Diabetes Program Recommendations:    -   Novolog 0-15 units Q4 while NPO -   A1c level  Thanks, Clotilda Bull RN, MSN, BC-ADM Inpatient Diabetes Coordinator Team Pager [phone] (8a-5p)

## 2023-11-11 NOTE — ED Notes (Signed)
 Patient transported to CT

## 2023-11-11 NOTE — ED Triage Notes (Signed)
 Pt states he has had chest pain radiating down left arm intermittently for past 5 days. Has had fatigue. Has a headache that has been intermittent for awhile. Pt has had bilateral leg numbness for past 5 days. Pt has left sided facial numbness that started 2 hours ago. Pt does have dizziness.

## 2023-11-11 NOTE — ED Provider Notes (Addendum)
 [facility] EMERGENCY DEPARTMENT AT [facility] Provider Note   CSN: [id number] Arrival date & time: 11/11/23  1137     Patient presents with: Chest Pain   [name] is a 53 y.o. male.   53 year old who presents with left-sided weakness that has been going on for about 4 days.  States that it is caused him to not be off balance.  Describes as weakness to his left arm and left leg.  Denies any headache with it.  No visual changes noted.  States has been persistent in nature.  Patient then developed left-sided chest pain rating down his left arm due to shoulder blades which is always been intermittent for several days.  No syncope or palpitations associated with this.  Symptoms are worse with exertion.  No treatment use prior to arrival       Prior to Admission medications   Medication Sig Start Date End Date Taking? Authorizing Provider  sulfamethoxazole -trimethoprim  (BACTRIM  DS) 800-160 MG tablet Take 1 tablet by mouth 2 (two) times daily. 10/30/22   Severa Rock HERO, FNP    Allergies: Penicillins    Review of Systems  Updated Vital Signs BP (!) 144/84   Pulse (!) 101   Temp 98.6 F (37 C)   Resp (!) 29   Ht 1.753 m (5' 9)   Wt 94.8 kg   SpO2 99%   BMI 30.86 kg/m   Physical Exam Vitals and nursing note reviewed.  Constitutional:      General: He is not in acute distress.    Appearance: Normal appearance. He is well-developed. He is not toxic-appearing.  HENT:     Head: Normocephalic and atraumatic.  Eyes:     General: Lids are normal.     Conjunctiva/sclera: Conjunctivae normal.     Pupils: Pupils are equal, round, and reactive to light.  Neck:     Thyroid : No thyroid  mass.     Trachea: No tracheal deviation.  Cardiovascular:     Rate and Rhythm: Normal rate and regular rhythm.     Heart sounds: Normal heart sounds. No murmur heard.    No gallop.  Pulmonary:     Effort: Pulmonary effort is normal. No respiratory distress.     Breath sounds:  Normal breath sounds. No stridor. No decreased breath sounds, wheezing, rhonchi or rales.  Abdominal:     General: There is no distension.     Palpations: Abdomen is soft.     Tenderness: There is no abdominal tenderness. There is no rebound.  Musculoskeletal:        General: No tenderness. Normal range of motion.     Cervical back: Normal range of motion and neck supple.  Skin:    General: Skin is warm and dry.     Findings: No abrasion or rash.  Neurological:     Mental Status: He is alert and oriented to person, place, and time. Mental status is at baseline.     GCS: GCS eye subscore is 4. GCS verbal subscore is 5. GCS motor subscore is 6.     Cranial Nerves: No cranial nerve deficit.     Sensory: No sensory deficit.     Motor: Weakness present.     Comments: Left upper and left lower extremity strength 4/5 No facial asymmetry.  Psychiatric:        Attention and Perception: Attention normal.        Speech: Speech normal.        Behavior:  Behavior normal.     (all labs ordered are listed, but only abnormal results are displayed) Labs Reviewed  CBG MONITORING, ED - Abnormal; Notable for the following components:      Result Value   Glucose-Capillary 390 (*)    All other components within normal limits  PROTIME-INR  APTT  CBC  DIFFERENTIAL  COMPREHENSIVE METABOLIC PANEL WITH GFR  ETHANOL  I-STAT CHEM 8, ED    EKG: EKG Interpretation Date/Time:  Monday November 11 2023 11:54:59 EDT Ventricular Rate:  100 PR Interval:  154 QRS Duration:  84 QT Interval:  334 QTC Calculation: 430 R Axis:   42  Text Interpretation: Normal sinus rhythm Anterior infarct , age undetermined Abnormal ECG No previous ECGs available Confirmed by Dasie Faden ([id number]) on 11/11/2023 12:21:44 PM  Radiology: No results found.   Procedures   Medications Ordered in the ED  sodium chloride  flush (NS) 0.9 % injection 3 mL (3 mLs Intravenous Given 11/11/23 1208)                                     Medical Decision Making Amount and/or Complexity of Data Reviewed Labs: ordered. Radiology: ordered.  Risk Prescription drug management.  Patient did have a fall in department due to being off balance.  No injury from that. Patient's EKG shows sinus rhythm.  Cardiac enzymes negative.  Low suspicion for ACS.  No ischemic changes noted.  Concern for possible dissection patient had a CT dissection study which was negative.  Mild hyperglycemia noted here.  Patient does have left-sided neurological deficits.  CT of head showed no acute findings.  With his persistent left-sided deficits concerning he has had a stroke.  Offered patient admission for further evaluation.  He has deferred at this time.  He has agreed to wait to have an MRI of his brain and that will be performed.  I will have things provider discussed results with him and try to convince him to stay.     Final diagnoses:  None    ED Discharge Orders     None          Dasie Faden, MD 11/11/23 1522    Dasie Faden, MD 11/11/23 [phone]

## 2023-11-11 NOTE — ED Notes (Signed)
 X-ray entered room and found patient lying in floor. They assisted patient to bed prior to me entering room. Patient stated he was trying to reach paper towels and fell. No LOC noted. No trauma noted to patient, he stated he was sore in lower back that was new from fall.

## 2023-11-13 ENCOUNTER — Ambulatory Visit: Admitting: Gastroenterology

## 2023-11-13 ENCOUNTER — Other Ambulatory Visit (INDEPENDENT_AMBULATORY_CARE_PROVIDER_SITE_OTHER)

## 2023-11-13 ENCOUNTER — Encounter: Payer: Self-pay | Admitting: Gastroenterology

## 2023-11-13 VITALS — BP 110/72 | HR 72 | Ht 67.5 in | Wt 214.4 lb

## 2023-11-13 DIAGNOSIS — R0789 Other chest pain: Secondary | ICD-10-CM

## 2023-11-13 DIAGNOSIS — R202 Paresthesia of skin: Secondary | ICD-10-CM | POA: Diagnosis not present

## 2023-11-13 DIAGNOSIS — R933 Abnormal findings on diagnostic imaging of other parts of digestive tract: Secondary | ICD-10-CM | POA: Diagnosis not present

## 2023-11-13 DIAGNOSIS — K219 Gastro-esophageal reflux disease without esophagitis: Secondary | ICD-10-CM

## 2023-11-13 LAB — BASIC METABOLIC PANEL WITH GFR
BUN: 10 mg/dL (ref 6–23)
CO2: 25 meq/L (ref 19–32)
Calcium: 9.3 mg/dL (ref 8.4–10.5)
Chloride: 100 meq/L (ref 96–112)
Creatinine, Ser: 0.64 mg/dL (ref 0.40–1.50)
GFR: 107.35 mL/min (ref >60.00)
Glucose, Bld: 255 mg/dL — ABNORMAL HIGH (ref 70–99)
Potassium: 4.1 meq/L (ref 3.5–5.1)
Sodium: 133 meq/L — ABNORMAL LOW (ref 135–145)

## 2023-11-13 LAB — B12 AND FOLATE PANEL
Folate: 19.9 ng/mL (ref >5.9)
Vitamin B-12: 202 pg/mL — ABNORMAL LOW (ref 211–911)

## 2023-11-13 MED ORDER — NA SULFATE-K SULFATE-MG SULF 17.5-3.13-1.6 GM/177ML PO SOLN
1.0000 | Freq: Once | ORAL | 0 refills | Status: AC
Start: 2023-11-13 — End: 2023-11-13

## 2023-11-13 NOTE — Progress Notes (Signed)
 Chief Complaint: abnormal CT scan, possible colon mass Primary GI Doctor: Dr. Suzann  HPI:  Patient is a  53  year old male patient with past medical history of bipolar disorder, DM, and high cholesterol, who was referred to me by Severa Rock HERO, FNP on 11/11/23 for a evaluation of colon mass.    11/11/23 patient presented to ED with left-sided weakness that had been going on for about 4 days. Patient then developed left-sided chest pain rating down his left arm due to shoulder blades which is always been intermittent for several days. EKG shows sinus rhythm.  Cardiac enzymes neg.   No ischemic changes noted.  Concern for possible dissection patient had a CT dissection study which was neg.  Mild hyperglycemia noted.  CT of head showed no acute findings. MRI does not show any evidence of stroke. Placed on metformin  for high glucose. Labs show: WBC 10.9, Hgb 15.8, sodium 132, BUN 9, creat 0.84, normal LFTs.   Interval History  Patient presents for evaluation of abnormal CT scan, accompanied by his significant other.  We reviewed patient's most recent ED visit with imaging as well as presenting symptoms. Patient denies altered bowel habits, abdominal pain, rectal bleeding.  Patient states he has continued to have intermittent episodes of left-sided chest discomfort that radiates to his shoulder and down his arms.  Patient states often times it only last for a few minutes.  No known triggers.  He does not note that is brought on by food or exercise.  Patient states he will also have intermittent episodes of nausea with the pain along with numbness and tingling in both hands and feet.  He was also noted in the emergency room that his glucose was elevated and patient was prescribed metformin .  He tells me today he has not picked up the prescription.  His last documented HA1C was from last March and it was 8.1.  Patient tells me he did cut out sodas and sweets.  He tells me at one point about 2 years ago he  weighed up to about 280 pounds.  He reports he has been more active at work and works in [employer].  Patient denies any symptoms of reflux or GERD.  Patient denies dysphagia.    Patient also tells me he has had a lot of neck and back injuries in the past from various incidents.  He reports due to this he has a lot of chronic neck and back pain.    No NSAID use.   Socially drinks. Smokes 2 packs per day since he was teenager.    Never had colonoscopy.  Surgical history: none  Patient's family history includes father leukemia, passed away at age 39, paternal grandfather with CA, unknown type.   Patient works in [employer].   Wt Readings from Last 3 Encounters:  11/13/23 214 lb 6 oz (97.2 kg)  11/11/23 209 lb (94.8 kg)  10/30/22 216 lb 3.2 oz (98.1 kg)    Past Medical History:  Diagnosis Date   Bipolar disorder (HCC)    Diabetes mellitus without complication (HCC)    High cholesterol    TIA (transient ischemic attack)    x2    Past Surgical History:  Procedure Laterality Date   NO PAST SURGERIES     Current Outpatient Medications  Medication Sig Dispense Refill   Na Sulfate-K Sulfate-Mg Sulfate concentrate (SUPREP) 17.5-3.13-1.6 GM/177ML SOLN Take 1 kit (354 mLs total) by mouth once for 1 dose. 354 mL 0  metFORMIN  (GLUCOPHAGE ) 500 MG tablet Take 1 tablet (500 mg total) by mouth 2 (two) times daily with a meal. (Patient not taking: Reported on 11/13/2023) 60 tablet 0   No current facility-administered medications for this visit.    Allergies as of 11/13/2023 - Review Complete 11/13/2023  Allergen Reaction Noted   Penicillins Other (See Comments) 01/23/2017    Family History  Problem Relation Age of Onset   Diabetes Mother    Hypertension Mother    Leukemia Father    Stroke Sister    Hypertension Sister    Clotting disorder Brother    Alcoholism Brother    Cancer Paternal Grandfather        type unknown   Insomnia Daughter    Anxiety disorder Daughter     Asthma Son    Heart disease Son    Insomnia Son    Allergic Disorder Son    Asthma Son    Insomnia Son    Cancer Paternal Uncle        type unknown    Review of Systems:    Constitutional: No weight loss, fever, chills, weakness or fatigue HEENT: Eyes: No change in vision               Ears, Nose, Throat:  No change in hearing or congestion Skin: No rash or itching Cardiovascular: No chest pain, chest pressure or palpitations   Respiratory: No SOB or cough Gastrointestinal: See HPI and otherwise negative Genitourinary: No dysuria or change in urinary frequency Neurological: No headache, dizziness or syncope Musculoskeletal: No new muscle or joint pain Hematologic: No bleeding or bruising Psychiatric: No history of depression or anxiety    Physical Exam:  Vital signs: BP 110/72 (BP Location: Left Arm, Patient Position: Sitting, Cuff Size: Normal)   Pulse 72   Ht 5' 7.5 (1.715 m) Comment: height measured without shoes  Wt 214 lb 6 oz (97.2 kg)   BMI 33.08 kg/m   Constitutional:   Pleasant male appears to be in NAD, Well developed, Well nourished, alert and cooperative Throat: Oral cavity and pharynx without inflammation, swelling or lesion.  Respiratory: Respirations even and unlabored. Lungs clear to auscultation bilaterally.   No wheezes, crackles, or rhonchi.  Cardiovascular: Normal S1, S2. Regular rate and rhythm. No peripheral edema, cyanosis or pallor.  Gastrointestinal:  Soft, nondistended, nontender. No rebound or guarding. Normal bowel sounds. No appreciable masses or hepatomegaly. Rectal:  Not performed.  Msk:  Symmetrical without gross deformities. Without edema, no deformity or joint abnormality.  Neurologic:  Alert and  oriented x4;  grossly normal neurologically.  Skin:   Dry and intact without significant lesions or rashes.  RELEVANT LABS AND IMAGING: CBC    Latest Ref Rng & Units 11/11/2023   10:40 PM 11/11/2023   12:09 PM 11/11/2023   12:03 PM  CBC   WBC 4.0 - 10.5 K/uL   10.9   Hemoglobin 13.0 - 17.0 g/dL 86.0  84.6  84.1   Hematocrit 39.0 - 52.0 % 41.0  45.0  44.3   Platelets 150 - 400 K/uL   344      CMP     Latest Ref Rng & Units 11/11/2023   10:40 PM 11/11/2023   10:30 PM 11/11/2023   12:09 PM  CMP  Glucose 70 - 99 mg/dL  690  636   BUN 6 - 20 mg/dL  10  11   Creatinine 9.38 - 1.24 mg/dL  9.27  9.29   Sodium 864 -  145 mmol/L 135  135  134   Potassium 3.5 - 5.1 mmol/L 3.9  4.1  3.7   Chloride 98 - 111 mmol/L  101  101   CO2 22 - 32 mmol/L  22    Calcium  8.9 - 10.3 mg/dL  8.4       Lab Results  Component Value Date   TSH 1.080 06/26/2022   11/11/23 CT angio chest/abd/pelvis IMPRESSION: 1. No acute intrathoracic, abdominal, or pelvic pathology. No aortic aneurysm or dissection. 2. Thickening and irregularity of the cecum and ascending colon most concerning for malignancy. Further evaluation with colonoscopy is recommended. 3. Sigmoid diverticulosis. 4. Rounded pericecal lymph nodes concerning for metastatic lymph node involvement.  Assessment: Encounter Diagnoses  Name Primary?   Gastroesophageal reflux disease, unspecified whether esophagitis present Yes   Abnormal CT scan, colon    Non-cardiac chest pain    Paresthesia     53 year old male patient who was referred for abnormal CT scan that showed thickening and irregularity of the cecum and ascending colon most concerning for malignancy.  Patient currently does not exhibit any lower GI symptoms.  Patient has never had a colonoscopy.  Will go ahead and schedule colonoscopy in [facility] with Dr. Suzann to evaluate.    Patient had presented to ED with left-sided weakness followed by chest pain and they ruled out any ischemic changes or stroke.  I will start patient on PPI therapy along with strict GERD diet to see if related to indigestion or reflux.  Also for the reported weakness and numbness/ tingling in bilateral hands and feet we discussed how this Keiana Tavella be related to  his uncontrolled diabetes or history of injury to neck and back resulting in neurological issues.  I will check a B12 level to make sure it is not due to B12 deficiency.  If normal recommended the patient discuss with PCP about diabetes management as well as getting imaging of neck and back.  Also recommended tobacco cessation.  Plan: - recheck CBC, BMP -check B 12 /folate -Start Omeprazole 40 mg po daily  - Strict GERD diet , no late meals 3-4 hours before lying down -recommend tobacco cessation -Schedule for a colonoscopy in [facility] with Dr. Suzann. The risks and benefits of colonoscopy with possible polypectomy / biopsies were discussed and the patient agrees to proceed.  -recommend he discuss with PCP about possible neck/back imaging -recommend he fup with patient on Diabetes management and HA1c   Thank you for the courtesy of this consult. Please call me with any questions or concerns.   Krayton Wortley, FNP-C [facility] 11/13/2023, 4:18 PM  Cc: Severa Rock HERO, FNP  I have reviewed the clinic note as outlined by Cathryne Beal, NP and agree with the assessment, plan and medical decision making.  Mr. Girtman is referred to the office for an incidental finding on CT imaging showing thickening and irregularity of the ascending colon and cecum worrisome for malignancy.  The CT was performed in the setting of an ER visit for chest pain and left-sided weakness -cardiac ischemia and CVA ruled out.  No prior history of colonoscopy.  Agree with proceeding with colonoscopy for further evaluation.  Noncardiac chest discomfort could reflect some degree of reflux and I agree with a trial of omeprazole.  Inocente Suzann, MD

## 2023-11-13 NOTE — Patient Instructions (Addendum)
 We have sent the following medications to your pharmacy for you to pick up at your convenience: SUPREP  Your provider has requested that you go to the basement level for lab work before leaving today. Press B on the elevator. The lab is located at the first door on the left as you exit the elevator.   You have been scheduled for a colonoscopy. Please follow written instructions given to you at your visit today.   If you use inhalers (even only as needed), please bring them with you on the day of your procedure.  DO NOT TAKE 7 DAYS PRIOR TO TEST- Trulicity (dulaglutide) Ozempic, Wegovy (semaglutide) Mounjaro (tirzepatide) Bydureon Bcise (exanatide extended release)  DO NOT TAKE 1 DAY PRIOR TO YOUR TEST Rybelsus (semaglutide) Adlyxin (lixisenatide) Victoza (liraglutide) Byetta (exanatide) ___________________________________________________________________________  _______________________________________________________  If your blood pressure at your visit was 140/90 or greater, please contact your primary care physician to follow up on this.  _______________________________________________________  If you are age 65 or older, your body mass index should be between 23-30. Your Body mass index is 33.08 kg/m. If this is out of the aforementioned range listed, please consider follow up with your Primary Care Provider.  If you are age 59 or younger, your body mass index should be between 19-25. Your Body mass index is 33.08 kg/m. If this is out of the aformentioned range listed, please consider follow up with your Primary Care Provider.   Due to recent changes in healthcare laws, you may see the results of your imaging and laboratory studies on MyChart before your provider has had a chance to review them.  We understand that in some cases there may be results that are confusing or concerning to you. Not all laboratory results come back in the same time frame and the provider may be  waiting for multiple results in order to interpret others.  Please give us  48 hours in order for your provider to thoroughly review all the results before contacting the office for clarification of your results.   ________________________________________________________  The [facility] providers would like to encourage you to use MYCHART to communicate with providers for non-urgent requests or questions.  Due to long hold times on the telephone, sending your provider a message by [facility] may be a faster and more efficient way to get a response.  Please allow 48 business hours for a response.  Please remember that this is for non-urgent requests.  _______________________________________________________  [facility] is using a team-based approach to care.  Your team is made up of your doctor and two to three APPS. Our APPS (Nurse Practitioners and Physician Assistants) work with your physician to ensure care continuity for you. They are fully qualified to address your health concerns and develop a treatment plan. They communicate directly with your gastroenterologist to care for you. Seeing the Advanced Practice Practitioners on your physician's team can help you by facilitating care more promptly, often allowing for earlier appointments, access to diagnostic testing, procedures, and other specialty referrals.  Thank you for trusting me with your gastrointestinal care. Deanna May, NP-C

## 2023-11-16 ENCOUNTER — Ambulatory Visit: Payer: Self-pay | Admitting: Gastroenterology

## 2023-11-17 NOTE — Progress Notes (Unsigned)
 [facility] History and Physical   Primary Care Physician:  Severa Rock HERO, FNP   Reason for Procedure:  Evaluation of abnormal colon findings on CT  Plan:    Colonoscopy     HPI: Ernest Montoya is a 53 y.o. male undergoing colonoscopy for evaluation of abnormal colon findings on CT. [name] was recently seen in the emergency room for left-sided weakness and chest pain.  TIA, CVA and cardiac ischemia ruled out.  CT angiogram was performed to evaluate for aortic dissection.  Incidental notation was seen of thickened cecum and ascending colon concerning for malignancy.  He has never had a colonoscopy.  No documented family history of colorectal cancer or polyps.  Patient denies lower GI symptoms of rectal bleeding or change in bowel habits.  Past Medical History:  Diagnosis Date   Bipolar disorder (HCC)    Diabetes mellitus without complication (HCC)    High cholesterol    TIA (transient ischemic attack)    x2    Past Surgical History:  Procedure Laterality Date   NO PAST SURGERIES      Prior to Admission medications   Medication Sig Start Date End Date Taking? Authorizing Provider  metFORMIN  (GLUCOPHAGE ) 500 MG tablet Take 1 tablet (500 mg total) by mouth 2 (two) times daily with a meal. Patient not taking: Reported on 11/13/2023 11/11/23   Freddi Hamilton, MD    Current Outpatient Medications  Medication Sig Dispense Refill   metFORMIN  (GLUCOPHAGE ) 500 MG tablet Take 1 tablet (500 mg total) by mouth 2 (two) times daily with a meal. (Patient not taking: Reported on 11/13/2023) 60 tablet 0   No current facility-administered medications for this visit.    Allergies as of 11/18/2023 - Review Complete 11/13/2023  Allergen Reaction Noted   Penicillins Other (See Comments) 01/23/2017    Family History  Problem Relation Age of Onset   Diabetes Mother    Hypertension Mother    Leukemia Father    Stroke Sister    Hypertension Sister    Clotting disorder Brother     Alcoholism Brother    Cancer Paternal Grandfather        type unknown   Insomnia Daughter    Anxiety disorder Daughter    Asthma Son    Heart disease Son    Insomnia Son    Allergic Disorder Son    Asthma Son    Insomnia Son    Cancer Paternal Uncle        type unknown    Social History   Socioeconomic History   Marital status: Married    Spouse name: Not on file   Number of children: 3   Years of education: Not on file   Highest education level: Not on file  Occupational History   Not on file  Tobacco Use   Smoking status: Every Day    Current packs/day: 1.50    Types: Cigarettes   Smokeless tobacco: Never  Vaping Use   Vaping status: Some Days   Substances: THC  Substance and Sexual Activity   Alcohol use: Yes    Comment: 1-2 beers occasional   Drug use: Yes    Types: Marijuana   Sexual activity: Yes  Other Topics Concern   Not on file  Social History Narrative   Not on file   Social Drivers of Health   Financial Resource Strain: Not on file  Food Insecurity: Not on file  Transportation Needs: Not on file  Physical Activity: Not  on file  Stress: Not on file  Social Connections: Not on file  Intimate Partner Violence: Not on file    Review of Systems:  All other review of systems negative except as mentioned in the HPI.  Physical Exam: Vital signs There were no vitals taken for this visit.  General:   Alert,  Well-developed, well-nourished, pleasant and cooperative in NAD Airway:  Mallampati  Lungs:  Clear throughout to auscultation.   Heart:  Regular rate and rhythm; no murmurs, clicks, rubs,  or gallops. Abdomen:  Soft, nontender and nondistended. Normal bowel sounds.   Neuro/Psych:  Normal mood and affect. A and O x 3  Inocente Hausen, MD [facility]

## 2023-11-18 ENCOUNTER — Encounter: Payer: Self-pay | Admitting: Pediatrics

## 2023-11-18 ENCOUNTER — Ambulatory Visit: Admitting: Pediatrics

## 2023-11-18 VITALS — BP 133/79 | HR 65 | Temp 98.2°F | Resp 16 | Ht 67.5 in | Wt 214.0 lb

## 2023-11-18 DIAGNOSIS — K648 Other hemorrhoids: Secondary | ICD-10-CM

## 2023-11-18 DIAGNOSIS — R933 Abnormal findings on diagnostic imaging of other parts of digestive tract: Secondary | ICD-10-CM | POA: Diagnosis not present

## 2023-11-18 DIAGNOSIS — F319 Bipolar disorder, unspecified: Secondary | ICD-10-CM | POA: Diagnosis not present

## 2023-11-18 DIAGNOSIS — E119 Type 2 diabetes mellitus without complications: Secondary | ICD-10-CM | POA: Diagnosis not present

## 2023-11-18 DIAGNOSIS — E7800 Pure hypercholesterolemia, unspecified: Secondary | ICD-10-CM | POA: Diagnosis not present

## 2023-11-18 DIAGNOSIS — D122 Benign neoplasm of ascending colon: Secondary | ICD-10-CM

## 2023-11-18 MED ORDER — SODIUM CHLORIDE 0.9 % IV SOLN: 500.0000 mL | Freq: Once | INTRAVENOUS | Status: DC

## 2023-11-18 NOTE — Patient Instructions (Addendum)
-   Repeat colonoscopy in 3 months to review the polypectomy site and because the bowel preparation was suboptimal. Two day bowel prep prior to next colonoscopy.   YOU HAD AN ENDOSCOPIC PROCEDURE TODAY AT THE [facility]:   Refer to the procedure report that was given to you for any specific questions about what was found during the examination.  If the procedure report does not answer your questions, please call your gastroenterologist to clarify.  If you requested that your care partner not be given the details of your procedure findings, then the procedure report has been included in a sealed envelope for you to review at your convenience later.  YOU SHOULD EXPECT: Some feelings of bloating in the abdomen. Passage of more gas than usual.  Walking can help get rid of the air that was put into your GI tract during the procedure and reduce the bloating. If you had a lower endoscopy (such as a colonoscopy or flexible sigmoidoscopy) you may notice spotting of blood in your stool or on the toilet paper. If you underwent a bowel prep for your procedure, you may not have a normal bowel movement for a few days.  Please Note:  You might notice some irritation and congestion in your nose or some drainage.  This is from the oxygen used during your procedure.  There is no need for concern and it should clear up in a day or so.  SYMPTOMS TO REPORT IMMEDIATELY:  Following lower endoscopy (colonoscopy or flexible sigmoidoscopy):  Excessive amounts of blood in the stool  Significant tenderness or worsening of abdominal pains  Swelling of the abdomen that is new, acute  Fever of 100F or higher  For urgent or emergent issues, a gastroenterologist can be reached at any hour by calling [phone]. Do not use MyChart messaging for urgent concerns.    DIET:  We do recommend a small meal at first, but then you may proceed to your regular diet.  Drink plenty of fluids but you should avoid alcoholic  beverages for 24 hours.  ACTIVITY:  You should plan to take it easy for the rest of today and you should NOT DRIVE or use heavy machinery until tomorrow (because of the sedation medicines used during the test).    FOLLOW UP: Our staff will call the number listed on your records the next business day following your procedure.  We will call around 7:15- 8:00 am to check on you and address any questions or concerns that you may have regarding the information given to you following your procedure. If we do not reach you, we will leave a message.     If any biopsies were taken you will be contacted by phone or by letter within the next 1-3 weeks.  Please call us  at [phone] if you have not heard about the biopsies in 3 weeks.    SIGNATURES/CONFIDENTIALITY: You and/or your care partner have signed paperwork which will be entered into your electronic medical record.  These signatures attest to the fact that that the information above on your After Visit Summary has been reviewed and is understood.  Full responsibility of the confidentiality of this discharge information lies with you and/or your care-partner.

## 2023-11-18 NOTE — Progress Notes (Signed)
 A/O x 3, gd SR's, VSS, report to RN

## 2023-11-18 NOTE — Progress Notes (Signed)
 Called to room to assist during endoscopic procedure.  Patient ID and intended procedure confirmed with present staff. Received instructions for my participation in the procedure from the performing physician.

## 2023-11-18 NOTE — Op Note (Signed)
 [facility] Patient Name: Ernest Montoya Procedure Date: 11/18/2023 11:21 AM MRN: [id number] Endoscopist: Inocente Hausen , MD, [id number] Age: 53 Referring MD:  Date of Birth: [birth year] Gender: Male Account #: [id number] Procedure:                Colonoscopy Indications:              This is the patient's first colonoscopy, Abnormal                            CT of the GI tract - Thickening of cecum and                            ascending colon concerning for potential malignancy Medicines:                Monitored Anesthesia Care Procedure:                Pre-Anesthesia Assessment:                           - Prior to the procedure, a History and Physical                            was performed, and patient medications and                            allergies were reviewed. The patient's tolerance of                            previous anesthesia was also reviewed. The risks                            and benefits of the procedure and the sedation                            options and risks were discussed with the patient.                            All questions were answered, and informed consent                            was obtained. Prior Anticoagulants: The patient has                            taken no anticoagulant or antiplatelet agents. ASA                            Grade Assessment: II - A patient with mild systemic                            disease. After reviewing the risks and benefits,                            the patient was deemed in satisfactory condition to  undergo the procedure.                           After obtaining informed consent, the colonoscope                            was passed under direct vision. Throughout the                            procedure, the patient's blood pressure, pulse, and                            oxygen saturations were monitored continuously. The                            CF [id number]  #[id number] was introduced through the anus                            and advanced to the cecum, identified by                            appendiceal orifice and ileocecal valve. The                            colonoscopy was performed without difficulty. The                            patient tolerated the procedure well. The quality                            of the bowel preparation was fair. The ileocecal                            valve, appendiceal orifice, and rectum were                            photographed. Scope In: 11:26:43 AM Scope Out: 12:06:39 PM Scope Withdrawal Time: 0 hours 34 minutes 50 seconds  Total Procedure Duration: 0 hours 39 minutes 56 seconds  Findings:                 The perianal and digital rectal examinations were                            normal. Pertinent negatives include normal                            sphincter tone and no palpable rectal lesions.                           A moderate amount of semi-solid stool was found in                            the entire colon, interfering with visualization.  Lavage of the area was performed using a large                            amount of sterile water, resulting in clearance                            with fair visualization.                           A 20 mm polyp was found in the proximal ascending                            colon. The polyp was sessile. The polyp was removed                            with a hot snare. The polyp was removed with a                            piecemeal technique using a hot snare. Resection                            and retrieval were complete.                           A 10 mm polyp was found in the ascending colon. The                            polyp was sessile. The polyp was removed with a hot                            snare. Resection and retrieval were complete.                           Internal hemorrhoids were found during  retroflexion. Complications:            No immediate complications. Estimated blood loss:                            Minimal. Estimated Blood Loss:     Estimated blood loss was minimal. Impression:               - Preparation of the colon was fair.                           - Stool in the entire examined colon.                           - One 20 mm polyp in the proximal ascending colon,                            removed with a hot snare and removed piecemeal                            using a  hot snare. Resected and retrieved.                           - One 10 mm polyp in the ascending colon, removed                            with a hot snare. Resected and retrieved.                           - Internal hemorrhoids.                           - Presence of colon polyps in the ascending colon                            likely explain CT findings. Given fair bowel                            preparation and piecemeal polypectomy recommend                            repeat colonoscopy within 3 months with a 2-day                            bowel prep. Recommendation:           - Discharge patient to home (ambulatory).                           - Await pathology results.                           - Repeat colonoscopy in 3 months to review the                            polypectomy site and because the bowel preparation                            was suboptimal. Two day bowel prep prior to next                            colonoscopy.                           - The findings and recommendations were discussed                            with the patient's family.                           - Patient has a contact number available for                            emergencies. The signs and symptoms of potential  delayed complications were discussed with the                            patient. Return to normal activities tomorrow.                            Written  discharge instructions were provided to the                            patient. Inocente Hausen, MD 11/18/2023 12:13:54 PM This report has been signed electronically.

## 2023-11-19 ENCOUNTER — Telehealth: Payer: Self-pay

## 2023-11-19 NOTE — Telephone Encounter (Signed)
  Follow up Call-     11/18/2023   10:28 AM  Call back number  Post procedure Call Back phone  # [phone]  Permission to leave phone message Yes     Patient questions:  Do you have a fever, pain , or abdominal swelling? No. Pain Score  0 *  Have you tolerated food without any problems? Yes.    Have you been able to return to your normal activities? Yes.    Do you have any questions about your discharge instructions: Diet   No. Medications  No. Follow up visit  No.  Do you have questions or concerns about your Care? No.  Actions: * If pain score is 4 or above: No action needed, pain <4.

## 2023-11-19 NOTE — Telephone Encounter (Signed)
-----   Message from Cathryne PARAS May sent at 11/16/2023 12:45 PM EDT ----- Karna- let the patient know his B 12 is low and may be related to some of the numbness/tingling he was having. He needs to get oral b 12 otc sublingual or liquid. He can ask pharmacist to help him  find. Take 1,000mcg po daily. Put in order to recheck in 3 mths. His sodium is slightly low and glucose high at 255. I have forwarded those to his PCP to address.  Cathryne, NP ----- Message ----- From: Interface, [facility] Sent: 11/13/2023   5:25 PM EDT To: Cathryne PARAS May, NP

## 2023-11-22 ENCOUNTER — Inpatient Hospital Stay: Admitting: Family Medicine

## 2023-11-22 LAB — SURGICAL PATHOLOGY

## 2023-11-22 NOTE — Progress Notes (Deleted)
 Subjective: CC: Hospital discharge follow-up PCP: Severa Rock HERO, FNP YEP:Izwwpd Ernest Montoya is Ernest 53 y.o. male presenting to clinic today for:  1.  ER follow-up Patient had some imaging done in the ER that demonstrated some lymph nodes concerning for possible colon malignancy.  He has since been seen by gastroenterology and has had colonoscopy.  His pathology results are still pending today.  He reports that he has been ***  ROS: Per HPI  Allergies  Allergen Reactions   Penicillins Other (See Comments)    Reaction happened during childhood- exact reaction not recalled   Past Medical History:  Diagnosis Date   Bipolar disorder (HCC)    Diabetes mellitus without complication (HCC)    High cholesterol    Stroke (HCC)    TIA (transient ischemic attack)    x2    Current Outpatient Medications:    metFORMIN  (GLUCOPHAGE ) 500 MG tablet, Take 1 tablet (500 mg total) by mouth 2 (two) times daily with Ernest meal. (Patient not taking: No sig reported), Disp: 60 tablet, Rfl: 0 Social History   Socioeconomic History   Marital status: Married    Spouse name: Not on file   Number of children: 3   Years of education: Not on file   Highest education level: Not on file  Occupational History   Not on file  Tobacco Use   Smoking status: Every Day    Current packs/day: 1.50    Types: Cigarettes   Smokeless tobacco: Never  Vaping Use   Vaping status: Some Days   Substances: THC  Substance and Sexual Activity   Alcohol use: Yes    Comment: 1-2 beers occasional   Drug use: Yes    Types: Marijuana    Comment: 11/18/23- over Ernest month ago   Sexual activity: Yes  Other Topics Concern   Not on file  Social History Narrative   Not on file   Social Drivers of Health   Financial Resource Strain: Not on file  Food Insecurity: Not on file  Transportation Needs: Not on file  Physical Activity: Not on file  Stress: Not on file  Social Connections: Not on file  Intimate Partner Violence: Not  on file   Family History  Problem Relation Age of Onset   Diabetes Mother    Hypertension Mother    Leukemia Father    Stroke Sister    Hypertension Sister    Clotting disorder Brother    Alcoholism Brother    Cancer Paternal Uncle        type unknown   Cancer Paternal Grandfather        type unknown   Insomnia Daughter    Anxiety disorder Daughter    Asthma Son    Heart disease Son    Insomnia Son    Allergic Disorder Son    Asthma Son    Insomnia Son    Colon cancer Neg Hx    Esophageal cancer Neg Hx    Stomach cancer Neg Hx     Objective: Office vital signs reviewed. There were no vitals taken for this visit.  Physical Examination:  General: Awake, alert, *** nourished, No acute distress HEENT: Normal    Neck: No masses palpated. No lymphadenopathy    Ears: Tympanic membranes intact, normal light reflex, no erythema, no bulging    Eyes: PERRLA, extraocular membranes intact, sclera ***    Nose: nasal turbinates moist, *** nasal discharge    Throat: moist mucus membranes, no erythema, ***  tonsillar exudate.  Airway is patent Cardio: regular rate and rhythm, S1S2 heard, no murmurs appreciated Pulm: clear to auscultation bilaterally, no wheezes, rhonchi or rales; normal work of breathing on room air GI: soft, non-tender, non-distended, bowel sounds present x4, no hepatomegaly, no splenomegaly, no masses GU: external vaginal tissue ***, cervix ***, *** punctate lesions on cervix appreciated, *** discharge from cervical os, *** bleeding, *** cervical motion tenderness, *** abdominal/ adnexal masses Extremities: warm, well perfused, No edema, cyanosis or clubbing; +*** pulses bilaterally MSK: *** gait and *** station Skin: dry; intact; no rashes or lesions Neuro: *** Strength and light touch sensation grossly intact, *** DTRs ***/4  CT Angio Chest/Abd/Pel for Dissection W and/or Wo Contrast Result Date: 11/11/2023 CLINICAL DATA:  Acute aortic syndrome suspected. EXAM: CT  ANGIOGRAPHY CHEST, ABDOMEN AND PELVIS TECHNIQUE: Non-contrast CT of the chest was initially obtained. Multidetector CT imaging through the chest, abdomen and pelvis was performed using the standard protocol during bolus administration of intravenous contrast. Multiplanar reconstructed images and MIPs were obtained and reviewed to evaluate the vascular anatomy. RADIATION DOSE REDUCTION: This exam was performed according to the departmental dose-optimization program which includes automated exposure control, adjustment of the mA and/or kV according to patient size and/or use of iterative reconstruction technique. CONTRAST:  OMNIPAQUE  IOHEXOL  350 MG/ML SOLN COMPARISON:  CT abdomen pelvis dated 07/24/2004. FINDINGS: CTA CHEST FINDINGS Cardiovascular: There is no cardiomegaly or pericardial effusion. The thoracic aorta is unremarkable. The origins of the great vessels of the aortic arch appear patent. No pulmonary artery embolus identified. Mediastinum/Nodes: No hilar or mediastinal adenopathy. The esophagus is grossly unremarkable no mediastinal fluid collection. Lungs/Pleura: No focal consolidation, pleural effusion, or pneumothorax. The central airways are patent. Musculoskeletal: Degenerative changes of the spine. Bilateral gynecomastia. No acute osseous pathology. Review of the MIP images confirms the above findings. CTA ABDOMEN AND PELVIS FINDINGS VASCULAR Aorta: Normal caliber aorta without aneurysm, dissection, vasculitis or significant stenosis. Celiac: Patent without evidence of aneurysm, dissection, vasculitis or significant stenosis. SMA: Patent without evidence of aneurysm, dissection, vasculitis or significant stenosis. Renals: Both renal arteries are patent without evidence of aneurysm, dissection, vasculitis, fibromuscular dysplasia or significant stenosis. IMA: Patent without evidence of aneurysm, dissection, vasculitis or significant stenosis. Inflow: Patent without evidence of aneurysm,  dissection, vasculitis or significant stenosis. Veins: No obvious venous abnormality within the limitations of this arterial phase study. Review of the MIP images confirms the above findings. NON-VASCULAR No intra-abdominal free air or free fluid. Hepatobiliary: The liver is unremarkable. No biliary dilatation. The gallbladder is unremarkable. Pancreas: Unremarkable. No pancreatic ductal dilatation or surrounding inflammatory changes. Spleen: Normal in size without focal abnormality. Adrenals/Urinary Tract: Indeterminate 1 cm right adrenal nodule, present on the CT of 2006, most consistent with an adenoma. The left adrenal gland is unremarkable. The kidneys, visualized ureters, and urinary bladder appear unremarkable. Stomach/Bowel: There is sigmoid diverticulosis. There is thickening and irregularity of the cecum and ascending colon most concerning for malignancy. Further evaluation with colonoscopy is recommended. Several mildly rounded pericecal lymph nodes may represent metastatic adenopathy. There is no bowel obstruction. The appendix is normal. Lymphatic: Rounded pericecal lymph nodes as above concerning for metastatic lymph node involvement. Reproductive: The prostate and seminal vesicles are grossly remarkable. Other: None Musculoskeletal: Degenerative changes of the spine. No acute osseous pathology. Review of the MIP images confirms the above findings. IMPRESSION: 1. No acute intrathoracic, abdominal, or pelvic pathology. No aortic aneurysm or dissection. 2. Thickening and irregularity of the cecum and ascending colon  most concerning for malignancy. Further evaluation with colonoscopy is recommended. 3. Sigmoid diverticulosis. 4. Rounded pericecal lymph nodes concerning for metastatic lymph node involvement. Electronically Signed   By: Vanetta Chou M.D.   On: 11/11/2023 14:59   Assessment/ Plan: 53 y.o. male   No diagnosis found.  ***   Myra Weng CHRISTELLA Fielding, DO [facility] [phone]

## 2023-11-25 ENCOUNTER — Encounter: Payer: Self-pay | Admitting: Family Medicine

## 2023-11-26 ENCOUNTER — Ambulatory Visit: Payer: Self-pay | Admitting: Pediatrics

## 2023-11-27 ENCOUNTER — Telehealth: Payer: Self-pay | Admitting: Pediatrics

## 2023-11-27 NOTE — Telephone Encounter (Signed)
 Inbound call from patient wanting to speak to nurse in regards to results to procedure done on 7/28  Please advise Thank you

## 2023-11-27 NOTE — Telephone Encounter (Signed)
 Called and spoke with patient regarding pathology results below. Patient is aware that he needs a repeat colonoscopy in 3 months which he already has scheduled. Patient is aware that this information is available in MyChart. Patient verbalized understanding and had no concerns at the end of the call.

## 2023-11-27 NOTE — Telephone Encounter (Signed)
 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  While tubular adenomas are a benign type of polyp, they are considered precancerous in nature.  That means that these polyps could have turned into colon cancer had they not been removed.   As we discussed after your procedure, your bowel preparation was suboptimal for polyp detection.  Given your suboptimal bowel prep and finding of 2 large polyps, one of which required piecemeal resection, I have advised a follow-up colonoscopy in 3 months with a 2-day bowel prep.

## 2023-12-31 ENCOUNTER — Encounter: Payer: Self-pay | Admitting: Family Medicine

## 2023-12-31 ENCOUNTER — Ambulatory Visit: Admitting: Family Medicine

## 2023-12-31 VITALS — BP 121/77 | HR 61 | Temp 98.5°F | Ht 67.5 in | Wt 217.0 lb

## 2023-12-31 DIAGNOSIS — J0100 Acute maxillary sinusitis, unspecified: Secondary | ICD-10-CM | POA: Diagnosis not present

## 2023-12-31 MED ORDER — PSEUDOEPHEDRINE-GUAIFENESIN ER 120-1200 MG PO TB12: 1.0000 | ORAL_TABLET | Freq: Two times a day (BID) | ORAL | 0 refills | Status: AC

## 2023-12-31 MED ORDER — SULFAMETHOXAZOLE-TRIMETHOPRIM 800-160 MG PO TABS: 1.0000 | ORAL_TABLET | Freq: Two times a day (BID) | ORAL | 0 refills | Status: AC

## 2023-12-31 NOTE — Progress Notes (Signed)
 Subjective:  Patient ID: Ernest Montoya, male    DOB: 1970/05/28  Age: 53 y.o. MRN: [id number]  CC: swollen sinus (Right side/X 1 week)   HPI  Discussed the use of AI scribe software for clinical note transcription with the patient, who gave verbal consent to proceed.  History of Present Illness Ernest Montoya is a 53 year old male who presents with facial swelling and pain.  He has been experiencing facial swelling and pain for the past week, primarily located in the cheeks and under the eyes, with significant tenderness over the right cheek.  He reports frequent nasal congestion, particularly on the right side, which feels clogged and sore. Blowing his nose is painful, with sensations akin to 'razor blades' and burning. There is no coughing, and nasal discharge does not have any color.  He has a known allergy to penicillin and has not experienced this type of infection before. He is familiar with the use of Bactrim  (sulfamethoxazole ) for treatment and reports that it works well for him.          12/31/2023   11:57 AM 10/30/2022   10:26 AM 06/26/2022    2:28 PM  Depression screen PHQ 2/9  Decreased Interest 2 0 0  Down, Depressed, Hopeless 0 0 0  PHQ - 2 Score 2 0 0  Altered sleeping 3 2 0  Tired, decreased energy 0 0 0  Change in appetite 0 0 0  Feeling bad or failure about yourself  0 0 0  Trouble concentrating 0 0 1  Moving slowly or fidgety/restless 0 0 1  Suicidal thoughts 0 0 0  PHQ-9 Score 5 2 2   Difficult doing work/chores Not difficult at all Not difficult at all Not difficult at all    History Kyandre has a past medical history of Bipolar disorder (HCC), Diabetes mellitus without complication (HCC), High cholesterol, Stroke (HCC), and TIA (transient ischemic attack).   He has no past surgical history on file.   His family history includes Alcoholism in his brother; Allergic Disorder in his son; Anxiety disorder in his daughter; Asthma in his son and son; Cancer  in his paternal grandfather and paternal uncle; Clotting disorder in his brother; Diabetes in his mother; Heart disease in his son; Hypertension in his mother and sister; Insomnia in his daughter, son, and son; Leukemia in his father; Stroke in his sister.He reports that he has been smoking cigarettes. He has never used smokeless tobacco. He reports current alcohol use. He reports current drug use. Drug: Marijuana.    ROS Review of Systems  Constitutional:  Negative for activity change, appetite change, chills and fever.  HENT:  Positive for congestion, postnasal drip, rhinorrhea and sinus pressure. Negative for ear discharge, ear pain, hearing loss, nosebleeds, sneezing and trouble swallowing.   Respiratory:  Negative for chest tightness and shortness of breath.   Cardiovascular:  Negative for chest pain and palpitations.  Skin:  Negative for rash.    Objective:  BP 121/77   Pulse 61   Temp 98.5 F (36.9 C)   Ht 5' 7.5 (1.715 m)   Wt 217 lb (98.4 kg)   SpO2 97%   BMI 33.49 kg/m   BP Readings from Last 3 Encounters:  12/31/23 121/77  11/18/23 133/79  11/13/23 110/72    Wt Readings from Last 3 Encounters:  12/31/23 217 lb (98.4 kg)  11/18/23 214 lb (97.1 kg)  11/13/23 214 lb 6 oz (97.2 kg)     Physical  Exam Vitals reviewed.  Constitutional:      Appearance: He is well-developed.  HENT:     Head: Normocephalic and atraumatic.     Comments: Right cheek tender to percussion. Infraorbital regions are puffy     Right Ear: Tympanic membrane and external ear normal. No decreased hearing noted.     Left Ear: Tympanic membrane and external ear normal. No decreased hearing noted.     Nose: Mucosal edema and congestion present.     Right Sinus: No frontal sinus tenderness.     Left Sinus: No frontal sinus tenderness.     Mouth/Throat:     Pharynx: No oropharyngeal exudate or posterior oropharyngeal erythema.  Eyes:     Pupils: Pupils are equal, round, and reactive to light.   Neck:     Meningeal: Brudzinski's sign absent.  Cardiovascular:     Rate and Rhythm: Normal rate and regular rhythm.     Heart sounds: No murmur heard. Pulmonary:     Effort: No respiratory distress.     Breath sounds: Normal breath sounds.  Musculoskeletal:     Cervical back: Normal range of motion and neck supple.  Lymphadenopathy:     Head:     Right side of head: No preauricular adenopathy.     Left side of head: No preauricular adenopathy.     Cervical:     Right cervical: No superficial cervical adenopathy.    Left cervical: No superficial cervical adenopathy.  Neurological:     Mental Status: He is alert and oriented to person, place, and time.      Assessment & Plan:  Acute maxillary sinusitis, recurrence not specified  Other orders -     Pseudoephedrine -guaiFENesin  ER; Take 1 tablet by mouth 2 (two) times daily. For congestion  Dispense: 14 tablet; Refill: 0 -     Sulfamethoxazole -Trimethoprim ; Take 1 tablet by mouth 2 (two) times daily. Until gone, for infection  Dispense: 20 tablet; Refill: 0    Assessment and Plan Assessment & Plan Acute right maxillary sinusitis   He presents with acute right maxillary sinusitis, characterized by facial swelling, tenderness over the right cheek, and nasal congestion for one week. There is no cough or colored nasal discharge. The right nasal passage is sore and feels clogged, with a burning sensation when blowing the nose. Lungs are clear on auscultation, indicating no lower respiratory involvement. Due to a penicillin allergy, prescribe Bactrim  (sulfamethoxazole ) twice a day for 10 days. Recommend Mucinex  D twice a day for congestion, to be discontinued once symptoms resolve. Send prescriptions to [facility].       Follow-up: No follow-ups on file.  Butler Der, M.D.

## 2024-01-21 ENCOUNTER — Telehealth: Payer: Self-pay

## 2024-01-21 DIAGNOSIS — K219 Gastro-esophageal reflux disease without esophagitis: Secondary | ICD-10-CM

## 2024-01-21 NOTE — Telephone Encounter (Signed)
-----   Message from [facility] J sent at 11/19/2023  3:35 PM EDT ----- Check b12 in october

## 2024-02-03 ENCOUNTER — Ambulatory Visit

## 2024-02-03 VITALS — Ht 67.5 in | Wt 206.0 lb

## 2024-02-03 DIAGNOSIS — Z860100 Personal history of colon polyps, unspecified: Secondary | ICD-10-CM

## 2024-02-03 MED ORDER — NA SULFATE-K SULFATE-MG SULF 17.5-3.13-1.6 GM/177ML PO SOLN: 1.0000 | Freq: Once | ORAL | 0 refills | Status: AC

## 2024-02-03 NOTE — Progress Notes (Signed)
 No egg or soy allergy known to patient  No issues known to pt with past sedation with any surgeries or procedures Patient denies ever being told they had issues or difficulty with intubation  No FH of Malignant Hyperthermia Pt is not on diet pills Pt is not on  home 02  Pt is not on blood thinners  Pt denies issues with constipation  No A fib or A flutter Have any cardiac testing pending-- no  LOA: independent  Prep: 2 day suprep    PV completed with patient. Prep instructions sent via mychart and home address.

## 2024-02-14 ENCOUNTER — Telehealth: Payer: Self-pay | Admitting: Pediatrics

## 2024-02-14 NOTE — Telephone Encounter (Signed)
 Inbound call from patient requesting for his prep medication to sent over to his pharmacy. Patient is requesting a call back to inform him that it has been sent over. Please advise.

## 2024-02-17 NOTE — Progress Notes (Unsigned)
 [facility] History and Physical   Primary Care Physician:  Severa Rock HERO, FNP   Reason for Procedure:  Follow-up of adenomatous colon polyps, suboptimal bowel preparation at the time of colonoscopy 10/2023  Plan:    Colonoscopy     HPI: Ernest Montoya is a 53 y.o. male undergoing colonoscopy to follow-up a history of adenomatous colon polyps and suboptimal bowel preparation at the time of colonoscopy 10/2023.  CT angio 10/2023 showed thickening of the cecum/ascending colon concerning for possible malignant process.  Subsequent colonoscopy disclosed 2 polyps measuring 10 to 20 mm in the ascending colon.  The 20 mm polyp was removed piecemeal.  Bowel preparation was fair and small polyps could have been obscured by stool.  Patient was advised to have a repeat colonoscopy with 2-day bowel prep within 3 months for reevaluation of piecemeal polypectomy site and to ensure no other polyps present.   Past Medical History:  Diagnosis Date   Bipolar disorder (HCC)    Diabetes mellitus without complication (HCC)    High cholesterol    Stroke (HCC)    TIA (transient ischemic attack)    x2    No past surgical history on file.  Prior to Admission medications   Medication Sig Start Date End Date Taking? Authorizing Provider  metFORMIN  (GLUCOPHAGE ) 500 MG tablet Take 1 tablet (500 mg total) by mouth 2 (two) times daily with a meal. 11/11/23   Freddi Hamilton, MD  Pseudoephedrine -Guaifenesin  [phone] MG TB12 Take 1 tablet by mouth 2 (two) times daily. For congestion Patient not taking: Reported on 02/03/2024 12/31/23   Zollie Lowers, MD  sulfamethoxazole -trimethoprim  (BACTRIM  DS) 800-160 MG tablet Take 1 tablet by mouth 2 (two) times daily. Until gone, for infection Patient not taking: Reported on 02/03/2024 12/31/23   Zollie Lowers, MD    Current Outpatient Medications  Medication Sig Dispense Refill   metFORMIN  (GLUCOPHAGE ) 500 MG tablet Take 1 tablet (500 mg total) by mouth 2 (two)  times daily with a meal. 60 tablet 0   Pseudoephedrine -Guaifenesin  [phone] MG TB12 Take 1 tablet by mouth 2 (two) times daily. For congestion (Patient not taking: Reported on 02/03/2024) 14 tablet 0   sulfamethoxazole -trimethoprim  (BACTRIM  DS) 800-160 MG tablet Take 1 tablet by mouth 2 (two) times daily. Until gone, for infection (Patient not taking: Reported on 02/03/2024) 20 tablet 0   No current facility-administered medications for this visit.    Allergies as of 02/18/2024 - Review Complete 02/03/2024  Allergen Reaction Noted   Penicillins Other (See Comments) 01/23/2017    Family History  Problem Relation Age of Onset   Diabetes Mother    Hypertension Mother    Leukemia Father    Stroke Sister    Hypertension Sister    Clotting disorder Brother    Alcoholism Brother    Cancer Paternal Uncle        type unknown   Cancer Paternal Grandfather        type unknown   Insomnia Daughter    Anxiety disorder Daughter    Asthma Son    Heart disease Son    Insomnia Son    Allergic Disorder Son    Asthma Son    Insomnia Son    Colon cancer Neg Hx    Esophageal cancer Neg Hx    Stomach cancer Neg Hx     Social History   Socioeconomic History   Marital status: Married    Spouse name: Not on file   Number of children: 3  Years of education: Not on file   Highest education level: Not on file  Occupational History   Not on file  Tobacco Use   Smoking status: Every Day    Current packs/day: 1.50    Types: Cigarettes   Smokeless tobacco: Never  Vaping Use   Vaping status: Some Days   Substances: THC  Substance and Sexual Activity   Alcohol use: Yes    Comment: 1-2 beers occasional   Drug use: Yes    Types: Marijuana    Comment: 11/18/23- over a month ago   Sexual activity: Yes  Other Topics Concern   Not on file  Social History Narrative   Not on file   Social Drivers of Health   Financial Resource Strain: Not on file  Food Insecurity: Not on file   Transportation Needs: Not on file  Physical Activity: Not on file  Stress: Not on file  Social Connections: Not on file  Intimate Partner Violence: Not on file    Review of Systems:  All other review of systems negative except as mentioned in the HPI.  Physical Exam: Vital signs There were no vitals taken for this visit.  General:   Alert,  Well-developed, well-nourished, pleasant and cooperative in NAD Airway:  Mallampati  Lungs:  Clear throughout to auscultation.   Heart:  Regular rate and rhythm; no murmurs, clicks, rubs,  or gallops. Abdomen:  Soft, nontender and nondistended. Normal bowel sounds.   Neuro/Psych:  Normal mood and affect. A and O x 3  Inocente Hausen, MD [facility]

## 2024-02-17 NOTE — Telephone Encounter (Signed)
 RN returned call to patient about his prep medication needing to be sent to his pharmacy. Patient stated he had picked up the Suprep yesterday. RN asked if patient had any further questions; patient stated, No.

## 2024-02-18 ENCOUNTER — Ambulatory Visit: Admitting: Pediatrics

## 2024-02-18 ENCOUNTER — Encounter: Payer: Self-pay | Admitting: Pediatrics

## 2024-02-18 VITALS — BP 123/79 | HR 68 | Temp 98.5°F | Resp 18 | Ht 67.5 in | Wt 206.0 lb

## 2024-02-18 DIAGNOSIS — Z1211 Encounter for screening for malignant neoplasm of colon: Secondary | ICD-10-CM | POA: Diagnosis not present

## 2024-02-18 DIAGNOSIS — Z860100 Personal history of colon polyps, unspecified: Secondary | ICD-10-CM

## 2024-02-18 DIAGNOSIS — F319 Bipolar disorder, unspecified: Secondary | ICD-10-CM | POA: Diagnosis not present

## 2024-02-18 DIAGNOSIS — D125 Benign neoplasm of sigmoid colon: Secondary | ICD-10-CM

## 2024-02-18 DIAGNOSIS — Z860101 Personal history of adenomatous and serrated colon polyps: Secondary | ICD-10-CM | POA: Diagnosis not present

## 2024-02-18 DIAGNOSIS — E119 Type 2 diabetes mellitus without complications: Secondary | ICD-10-CM | POA: Diagnosis not present

## 2024-02-18 DIAGNOSIS — D123 Benign neoplasm of transverse colon: Secondary | ICD-10-CM | POA: Diagnosis not present

## 2024-02-18 DIAGNOSIS — E7800 Pure hypercholesterolemia, unspecified: Secondary | ICD-10-CM | POA: Diagnosis not present

## 2024-02-18 DIAGNOSIS — K648 Other hemorrhoids: Secondary | ICD-10-CM | POA: Diagnosis not present

## 2024-02-18 MED ORDER — SODIUM CHLORIDE 0.9 % IV SOLN: 500.0000 mL | Freq: Once | INTRAVENOUS | Status: DC

## 2024-02-18 NOTE — Op Note (Signed)
 [facility] Patient Name: Ernest Montoya Procedure Date: 02/18/2024 7:38 AM MRN: [id number] Endoscopist: Inocente Hausen , MD, [id number] Age: 53 Referring MD:  Date of Birth: 1970-04-28 Gender: Male Account #: [id number] Procedure:                Colonoscopy Indications:              Last colonoscopy: July 2025, Personal history of                            colonic polyps -status post removal of large colon                            polyps 10/2023 in the ascending colon one of which                            required piecemeal resection. Bowel prep at time of                            last colonoscopy was an adequate. Repeat                            colonoscopy is performed today with 2-day bowel                            prep. Medicines:                Monitored Anesthesia Care Procedure:                Pre-Anesthesia Assessment:                           - Prior to the procedure, a History and Physical                            was performed, and patient medications and                            allergies were reviewed. The patient's tolerance of                            previous anesthesia was also reviewed. The risks                            and benefits of the procedure and the sedation                            options and risks were discussed with the patient.                            All questions were answered, and informed consent                            was obtained. Prior Anticoagulants: The patient has  taken no anticoagulant or antiplatelet agents. ASA                            Grade Assessment: II - A patient with mild systemic                            disease. After reviewing the risks and benefits,                            the patient was deemed in satisfactory condition to                            undergo the procedure.                           After obtaining informed consent, the colonoscope                             was passed under direct vision. Throughout the                            procedure, the patient's blood pressure, pulse, and                            oxygen saturations were monitored continuously. The                            Olympus Scope [id number] was introduced through the                            anus and advanced to the cecum, identified by                            appendiceal orifice and ileocecal valve. The                            colonoscopy was performed without difficulty. The                            patient tolerated the procedure well. The quality                            of the bowel preparation was unsatisfactory. The                            ileocecal valve, appendiceal orifice, and rectum                            were photographed. Scope In: 7:58:41 AM Scope Out: 8:24:13 AM Scope Withdrawal Time: 0 hours 22 minutes 38 seconds  Total Procedure Duration: 0 hours 25 minutes 32 seconds  Findings:                 The perianal examination was normal.  The digital rectal exam was normal. Pertinent                            negatives include normal sphincter tone and no                            palpable rectal lesions.                           A moderate amount of semi-solid stool was found in                            the entire colon, interfering with visualization.                            Lavage of the area was performed using copious                            amounts of sterile water, resulting in incomplete                            clearance with fair visualization.                           Two sessile polyps were found in the sigmoid colon                            and transverse colon. The polyps were 5 to 6 mm in                            size. These polyps were removed with a cold snare.                            Resection and retrieval were complete.                           Internal  hemorrhoids were found during retroflexion. Complications:            No immediate complications. Estimated blood loss:                            Minimal. Estimated Blood Loss:     Estimated blood loss was minimal. Impression:               - Preparation of the colon was unsatisfactory.                           - Stool in the entire examined colon.                           - Two 5 to 6 mm polyps in the sigmoid colon and in                            the transverse colon, removed with a cold snare.  Resected and retrieved.                           - Internal hemorrhoids. Recommendation:           - Discharge patient to home (ambulatory).                           - Await pathology results.                           - Repeat colonoscopy in 1 year because the bowel                            preparation was suboptimal. At the time of next                            colonoscopy recommended bowel prep. The week                            leading up to colonoscopy advised that the patient                            takes 6 ounces of magnesium citrate daily x 7 days.                           - The findings and recommendations were discussed                            with the patient's family.                           - Patient has a contact number available for                            emergencies. The signs and symptoms of potential                            delayed complications were discussed with the                            patient. Return to normal activities tomorrow.                            Written discharge instructions were provided to the                            patient. Inocente Hausen, MD 02/18/2024 8:31:55 AM This report has been signed electronically.

## 2024-02-18 NOTE — Progress Notes (Signed)
 Report to PACU, RN, vss, BBS= Clear.

## 2024-02-18 NOTE — Progress Notes (Signed)
 Pt's states no medical or surgical changes since previsit or office visit.

## 2024-02-18 NOTE — Progress Notes (Signed)
 Called to room to assist during endoscopic procedure.  Patient ID and intended procedure confirmed with present staff. Received instructions for my participation in the procedure from the performing physician.

## 2024-02-18 NOTE — Patient Instructions (Signed)

## 2024-02-19 ENCOUNTER — Telehealth: Payer: Self-pay

## 2024-02-19 NOTE — Telephone Encounter (Signed)
 No answer, unable to leave a message, B.Kytzia Gienger RN.

## 2024-02-21 ENCOUNTER — Ambulatory Visit: Payer: Self-pay | Admitting: Pediatrics

## 2024-02-21 LAB — SURGICAL PATHOLOGY

## 2024-03-13 ENCOUNTER — Telehealth: Payer: Self-pay | Admitting: Pediatrics

## 2024-03-13 NOTE — Telephone Encounter (Signed)
Patient calling in regards to results. Please advise.

## 2024-03-16 NOTE — Telephone Encounter (Signed)
 Called patient twice at his listed number. Line rings then goes to fast busy signal. Left message for patient's wife Alan asking that she let patient know to reach out to our office to discuss results.
